# Patient Record
Sex: Female | Born: 1935 | Race: White | Hispanic: No | Marital: Married | Smoking: Never smoker
Health system: Southern US, Community
[De-identification: ages and names within clinical notes are randomized; demographics above are authoritative.]

## PROBLEM LIST (undated history)

## (undated) DIAGNOSIS — M810 Age-related osteoporosis without current pathological fracture: Secondary | ICD-10-CM

## (undated) DIAGNOSIS — M1990 Unspecified osteoarthritis, unspecified site: Secondary | ICD-10-CM

## (undated) DIAGNOSIS — E785 Hyperlipidemia, unspecified: Secondary | ICD-10-CM

## (undated) DIAGNOSIS — G4700 Insomnia, unspecified: Secondary | ICD-10-CM

## (undated) DIAGNOSIS — I493 Ventricular premature depolarization: Secondary | ICD-10-CM

## (undated) DIAGNOSIS — E7800 Pure hypercholesterolemia, unspecified: Secondary | ICD-10-CM

## (undated) DIAGNOSIS — K5792 Diverticulitis of intestine, part unspecified, without perforation or abscess without bleeding: Secondary | ICD-10-CM

## (undated) DIAGNOSIS — H409 Unspecified glaucoma: Secondary | ICD-10-CM

## (undated) DIAGNOSIS — I10 Essential (primary) hypertension: Secondary | ICD-10-CM

## (undated) HISTORY — PX: OOPHORECTOMY: SHX86

## (undated) HISTORY — PX: ABDOMINAL HYSTERECTOMY: SHX81

## (undated) HISTORY — PX: HERNIA REPAIR: SHX51

## (undated) HISTORY — PX: APPENDECTOMY: SHX54

## (undated) HISTORY — PX: EYE SURGERY: SHX253

---

## 2000-07-11 DIAGNOSIS — I10 Essential (primary) hypertension: Secondary | ICD-10-CM | POA: Insufficient documentation

## 2000-07-11 DIAGNOSIS — M1990 Unspecified osteoarthritis, unspecified site: Secondary | ICD-10-CM | POA: Insufficient documentation

## 2000-07-11 DIAGNOSIS — F329 Major depressive disorder, single episode, unspecified: Secondary | ICD-10-CM | POA: Insufficient documentation

## 2000-07-11 DIAGNOSIS — E789 Disorder of lipoprotein metabolism, unspecified: Secondary | ICD-10-CM | POA: Insufficient documentation

## 2000-08-03 DIAGNOSIS — N859 Noninflammatory disorder of uterus, unspecified: Secondary | ICD-10-CM | POA: Insufficient documentation

## 2000-08-03 DIAGNOSIS — H409 Unspecified glaucoma: Secondary | ICD-10-CM | POA: Insufficient documentation

## 2008-01-05 ENCOUNTER — Ambulatory Visit: Payer: Self-pay | Admitting: Family Medicine

## 2008-01-27 ENCOUNTER — Ambulatory Visit: Payer: Self-pay | Admitting: Family Medicine

## 2008-01-27 ENCOUNTER — Ambulatory Visit: Payer: Self-pay

## 2008-08-14 ENCOUNTER — Encounter: Payer: Self-pay | Admitting: Orthopaedic Surgery

## 2008-09-05 ENCOUNTER — Encounter: Payer: Self-pay | Admitting: Orthopaedic Surgery

## 2009-07-04 IMAGING — CR DG CHEST 2V
1 series · 2 of 2 positions shown · non-contrast
Comparison: none

REASON FOR EXAM: cough, fever
COMMENTS:

[Series 1: view not recorded · 0.17mm/px · 2 of 2 slices shown]
[im 1/2]
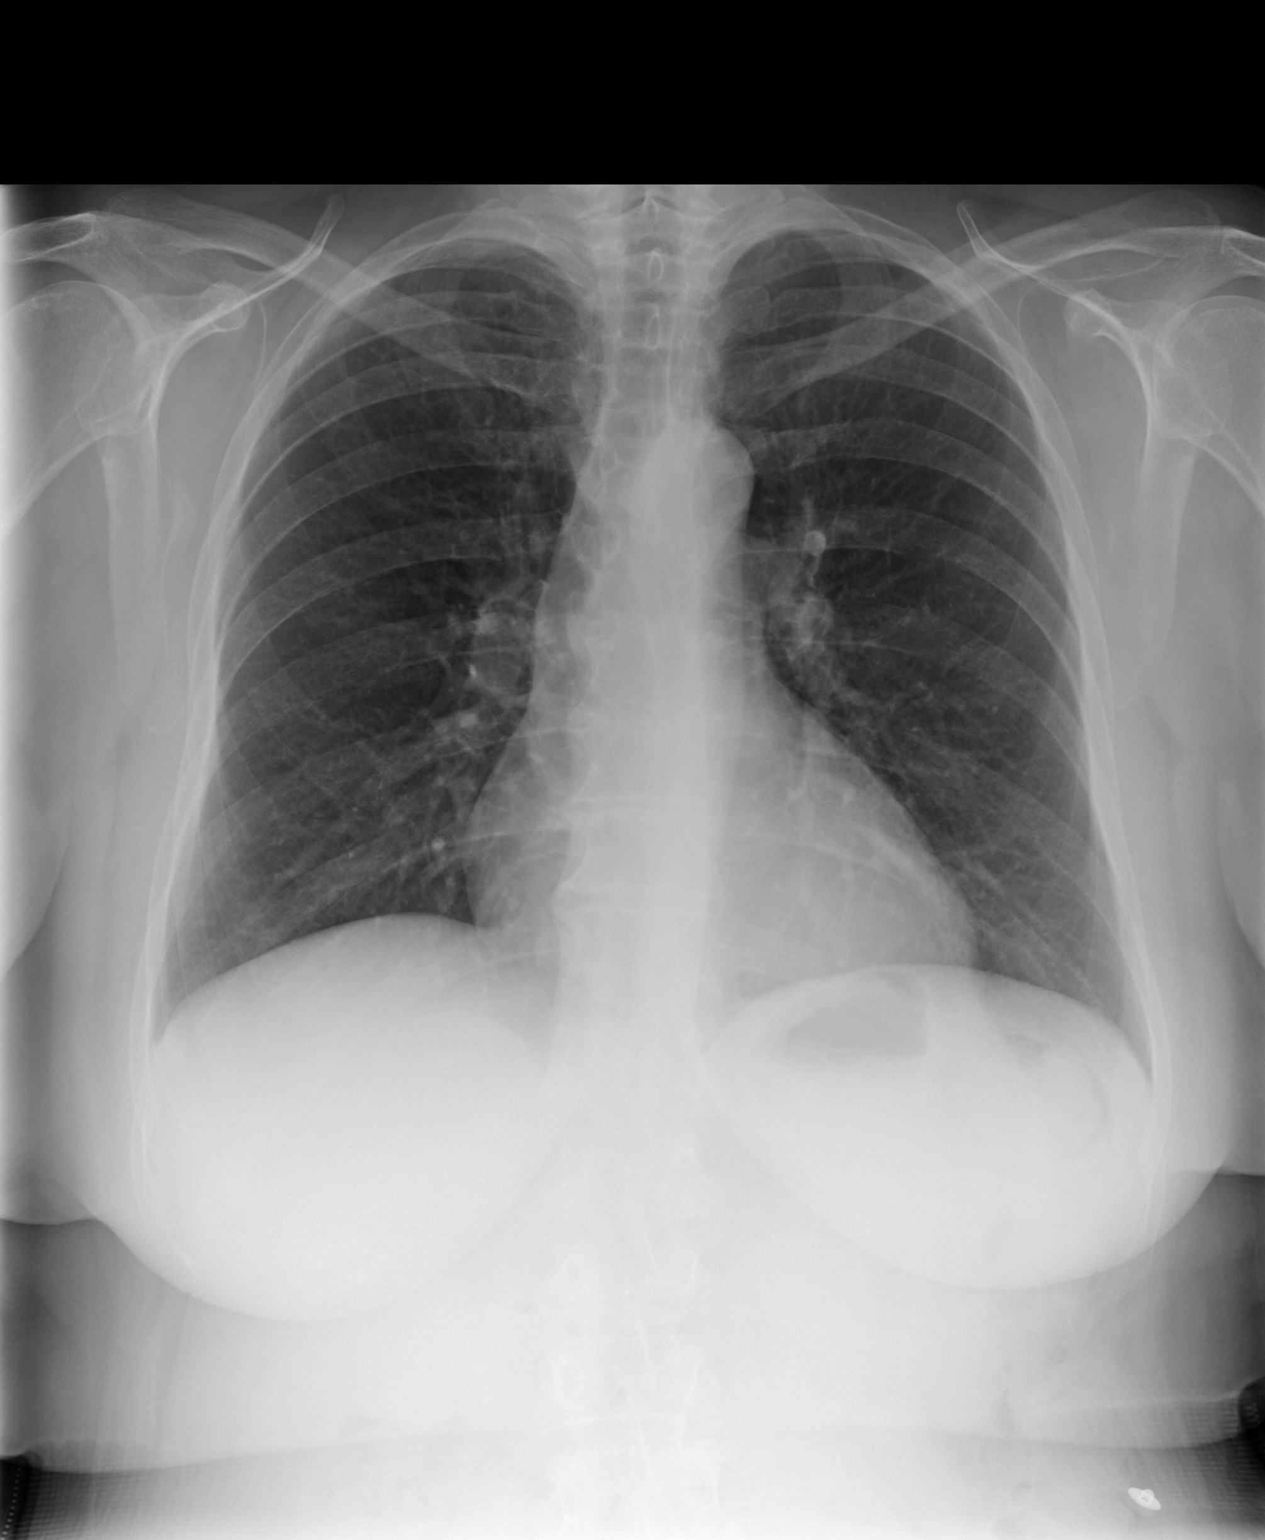
[im 2/2]
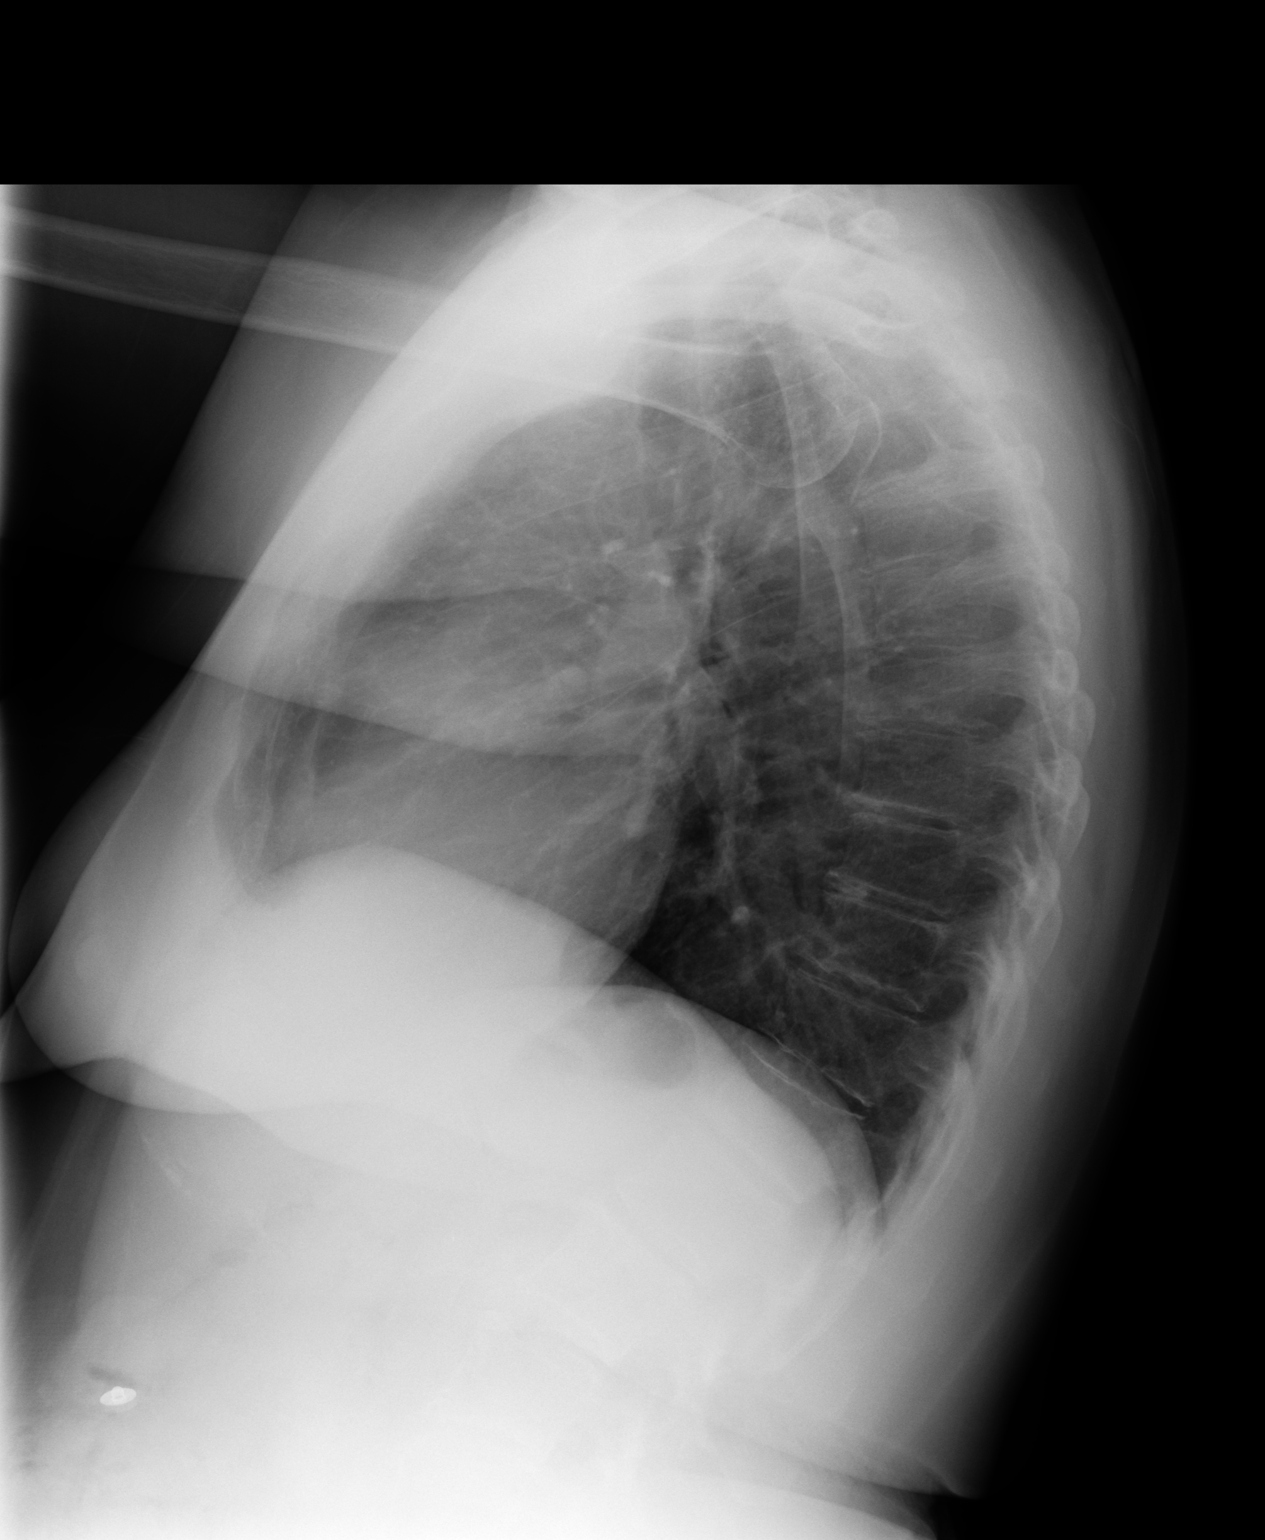

[2 of 2 positions shown; findings below may reference images not displayed]

PROCEDURE:     MDR - MDR CHEST PA(OR AP) AND LATERAL  - January 05, 2008 [DATE]

RESULT:     There is no prior exam for comparison.

The lungs are clear. The heart and pulmonary vessels are normal. The bony
and mediastinal structures are unremarkable. There is no effusion. There is
no pneumothorax or evidence of congestive failure.
IMPRESSION: No acute cardiopulmonary disease.

## 2011-01-05 DIAGNOSIS — K219 Gastro-esophageal reflux disease without esophagitis: Secondary | ICD-10-CM | POA: Insufficient documentation

## 2013-01-29 DIAGNOSIS — F411 Generalized anxiety disorder: Secondary | ICD-10-CM | POA: Insufficient documentation

## 2013-07-20 DIAGNOSIS — G5600 Carpal tunnel syndrome, unspecified upper limb: Secondary | ICD-10-CM | POA: Insufficient documentation

## 2013-10-15 DIAGNOSIS — K5900 Constipation, unspecified: Secondary | ICD-10-CM | POA: Insufficient documentation

## 2013-10-15 DIAGNOSIS — G629 Polyneuropathy, unspecified: Secondary | ICD-10-CM | POA: Insufficient documentation

## 2013-10-31 ENCOUNTER — Ambulatory Visit: Payer: Self-pay | Admitting: Physician Assistant

## 2013-10-31 LAB — CBC WITH DIFFERENTIAL/PLATELET
Basophil #: 0.1 10*3/uL (ref 0.0–0.1)
Basophil %: 0.8 %
Eosinophil #: 0.1 10*3/uL (ref 0.0–0.7)
Eosinophil %: 1.8 %
HCT: 38 % (ref 35.0–47.0)
Lymphocyte #: 1.6 10*3/uL (ref 1.0–3.6)
MCHC: 34.9 g/dL (ref 32.0–36.0)
Monocyte #: 0.6 x10 3/mm (ref 0.2–0.9)
Monocyte %: 8.2 %
Neutrophil %: 68.4 %
Platelet: 197 10*3/uL (ref 150–440)
RBC: 4.29 10*6/uL (ref 3.80–5.20)
WBC: 7.6 10*3/uL (ref 3.6–11.0)

## 2013-10-31 LAB — URINALYSIS, COMPLETE
Bilirubin,UR: NEGATIVE
Ketone: NEGATIVE
Nitrite: NEGATIVE
Specific Gravity: 1.01 (ref 1.003–1.030)

## 2013-10-31 LAB — COMPREHENSIVE METABOLIC PANEL
BUN: 15 mg/dL (ref 7–18)
Bilirubin,Total: 0.3 mg/dL (ref 0.2–1.0)
Calcium, Total: 9.2 mg/dL (ref 8.5–10.1)
Chloride: 97 mmol/L — ABNORMAL LOW (ref 98–107)
Co2: 27 mmol/L (ref 21–32)
Creatinine: 0.76 mg/dL (ref 0.60–1.30)
EGFR (African American): >60
Glucose: 117 mg/dL — ABNORMAL HIGH (ref 65–99)
Osmolality: 270 (ref 275–301)
Potassium: 3.8 mmol/L (ref 3.5–5.1)
SGOT(AST): 21 U/L (ref 15–37)
SGPT (ALT): 28 U/L (ref 12–78)
Total Protein: 7.1 g/dL (ref 6.4–8.2)

## 2014-01-28 ENCOUNTER — Emergency Department: Payer: Self-pay | Admitting: Emergency Medicine

## 2014-01-28 ENCOUNTER — Ambulatory Visit: Payer: Self-pay | Admitting: Physician Assistant

## 2014-01-28 LAB — BASIC METABOLIC PANEL
ANION GAP: 6 — AB (ref 7–16)
BUN: 16 mg/dL (ref 7–18)
Calcium, Total: 9.4 mg/dL (ref 8.5–10.1)
Chloride: 97 mmol/L — ABNORMAL LOW (ref 98–107)
Co2: 26 mmol/L (ref 21–32)
Creatinine: 0.59 mg/dL — ABNORMAL LOW (ref 0.60–1.30)
EGFR (African American): >60
EGFR (Non-African Amer.): >60
Glucose: 114 mg/dL — ABNORMAL HIGH (ref 65–99)
Osmolality: 261 (ref 275–301)
POTASSIUM: 4.2 mmol/L (ref 3.5–5.1)
Sodium: 129 mmol/L — ABNORMAL LOW (ref 136–145)

## 2014-01-28 LAB — URINALYSIS, COMPLETE
BLOOD: NEGATIVE
Bilirubin,UR: NEGATIVE
Glucose,UR: NEGATIVE mg/dL (ref 0–75)
KETONE: NEGATIVE
NITRITE: NEGATIVE
PH: 6 (ref 4.5–8.0)
Protein: NEGATIVE
Specific Gravity: 1.011 (ref 1.003–1.030)
Squamous Epithelial: 1
WBC UR: 53 /HPF (ref 0–5)

## 2014-01-28 LAB — CBC
HCT: 41.8 % (ref 35.0–47.0)
HGB: 14 g/dL (ref 12.0–16.0)
MCH: 29.5 pg (ref 26.0–34.0)
MCHC: 33.5 g/dL (ref 32.0–36.0)
MCV: 88 fL (ref 80–100)
Platelet: 198 10*3/uL (ref 150–440)
RBC: 4.75 10*6/uL (ref 3.80–5.20)
RDW: 12.7 % (ref 11.5–14.5)
WBC: 11.9 10*3/uL — AB (ref 3.6–11.0)

## 2014-01-28 LAB — TROPONIN I: Troponin-I: <0.02 ng/mL

## 2014-03-27 DIAGNOSIS — H9319 Tinnitus, unspecified ear: Secondary | ICD-10-CM | POA: Insufficient documentation

## 2014-03-27 DIAGNOSIS — N390 Urinary tract infection, site not specified: Secondary | ICD-10-CM | POA: Insufficient documentation

## 2014-03-27 DIAGNOSIS — G4700 Insomnia, unspecified: Secondary | ICD-10-CM | POA: Insufficient documentation

## 2014-03-27 DIAGNOSIS — M25519 Pain in unspecified shoulder: Secondary | ICD-10-CM | POA: Insufficient documentation

## 2014-04-14 DIAGNOSIS — E222 Syndrome of inappropriate secretion of antidiuretic hormone: Secondary | ICD-10-CM | POA: Insufficient documentation

## 2014-05-06 ENCOUNTER — Ambulatory Visit: Payer: Self-pay | Admitting: Family Medicine

## 2015-02-13 DIAGNOSIS — IMO0002 Reserved for concepts with insufficient information to code with codable children: Secondary | ICD-10-CM | POA: Insufficient documentation

## 2015-07-09 DIAGNOSIS — Z8719 Personal history of other diseases of the digestive system: Secondary | ICD-10-CM | POA: Insufficient documentation

## 2015-08-01 ENCOUNTER — Other Ambulatory Visit: Payer: Self-pay | Admitting: Family Medicine

## 2015-08-01 DIAGNOSIS — IMO0002 Reserved for concepts with insufficient information to code with codable children: Secondary | ICD-10-CM

## 2015-08-25 ENCOUNTER — Other Ambulatory Visit: Payer: Self-pay | Admitting: Family Medicine

## 2015-08-25 DIAGNOSIS — Z1231 Encounter for screening mammogram for malignant neoplasm of breast: Secondary | ICD-10-CM

## 2015-09-10 ENCOUNTER — Ambulatory Visit
Admission: RE | Admit: 2015-09-10 | Discharge: 2015-09-10 | Disposition: A | Discharge status: Home / Self Care | Payer: Medicare Other | Source: Ambulatory Visit | Attending: Family Medicine | Admitting: Family Medicine

## 2015-09-10 DIAGNOSIS — M810 Age-related osteoporosis without current pathological fracture: Secondary | ICD-10-CM | POA: Insufficient documentation

## 2015-09-10 DIAGNOSIS — T148 Other injury of unspecified body region: Secondary | ICD-10-CM | POA: Insufficient documentation

## 2015-09-10 DIAGNOSIS — Z1231 Encounter for screening mammogram for malignant neoplasm of breast: Secondary | ICD-10-CM | POA: Insufficient documentation

## 2015-09-10 DIAGNOSIS — X58XXXA Exposure to other specified factors, initial encounter: Secondary | ICD-10-CM | POA: Insufficient documentation

## 2015-09-10 DIAGNOSIS — IMO0002 Reserved for concepts with insufficient information to code with codable children: Secondary | ICD-10-CM

## 2015-11-04 DIAGNOSIS — M810 Age-related osteoporosis without current pathological fracture: Secondary | ICD-10-CM | POA: Insufficient documentation

## 2016-08-03 ENCOUNTER — Other Ambulatory Visit: Payer: Self-pay | Admitting: Family Medicine

## 2016-08-03 DIAGNOSIS — Z1231 Encounter for screening mammogram for malignant neoplasm of breast: Secondary | ICD-10-CM

## 2016-09-13 ENCOUNTER — Ambulatory Visit: Admission: RE | Admit: 2016-09-13 | Payer: Medicare Other | Source: Ambulatory Visit

## 2016-10-06 DIAGNOSIS — R0789 Other chest pain: Secondary | ICD-10-CM | POA: Insufficient documentation

## 2016-10-08 ENCOUNTER — Ambulatory Visit
Admission: RE | Admit: 2016-10-08 | Discharge: 2016-10-08 | Disposition: A | Discharge status: Home / Self Care | Payer: Medicare Other | Source: Ambulatory Visit | Attending: Family Medicine | Admitting: Family Medicine

## 2016-10-08 DIAGNOSIS — Z1231 Encounter for screening mammogram for malignant neoplasm of breast: Secondary | ICD-10-CM | POA: Diagnosis present

## 2017-02-25 DIAGNOSIS — I493 Ventricular premature depolarization: Secondary | ICD-10-CM | POA: Insufficient documentation

## 2017-09-01 ENCOUNTER — Other Ambulatory Visit: Payer: Self-pay | Admitting: Family Medicine

## 2017-09-01 DIAGNOSIS — Z1231 Encounter for screening mammogram for malignant neoplasm of breast: Secondary | ICD-10-CM

## 2017-10-10 ENCOUNTER — Ambulatory Visit
Admission: RE | Admit: 2017-10-10 | Discharge: 2017-10-10 | Disposition: A | Discharge status: Home / Self Care | Payer: Medicare Other | Source: Ambulatory Visit | Attending: Family Medicine | Admitting: Family Medicine

## 2017-10-10 DIAGNOSIS — Z1231 Encounter for screening mammogram for malignant neoplasm of breast: Secondary | ICD-10-CM | POA: Diagnosis not present

## 2018-02-07 ENCOUNTER — Ambulatory Visit
Admission: EM | Admit: 2018-02-07 | Discharge: 2018-02-07 | Disposition: A | Discharge status: Home / Self Care | Payer: Medicare Other | Attending: Family Medicine | Admitting: Family Medicine

## 2018-02-07 ENCOUNTER — Other Ambulatory Visit: Payer: Self-pay

## 2018-02-07 ENCOUNTER — Encounter: Payer: Self-pay | Admitting: Emergency Medicine

## 2018-02-07 DIAGNOSIS — I10 Essential (primary) hypertension: Secondary | ICD-10-CM

## 2018-02-07 DIAGNOSIS — S161XXA Strain of muscle, fascia and tendon at neck level, initial encounter: Secondary | ICD-10-CM | POA: Diagnosis not present

## 2018-02-07 DIAGNOSIS — N3000 Acute cystitis without hematuria: Secondary | ICD-10-CM

## 2018-02-07 DIAGNOSIS — R350 Frequency of micturition: Secondary | ICD-10-CM | POA: Diagnosis not present

## 2018-02-07 HISTORY — DX: Age-related osteoporosis without current pathological fracture: M81.0

## 2018-02-07 HISTORY — DX: Ventricular premature depolarization: I49.3

## 2018-02-07 HISTORY — DX: Diverticulitis of intestine, part unspecified, without perforation or abscess without bleeding: K57.92

## 2018-02-07 HISTORY — DX: Hyperlipidemia, unspecified: E78.5

## 2018-02-07 HISTORY — DX: Unspecified glaucoma: H40.9

## 2018-02-07 HISTORY — DX: Pure hypercholesterolemia, unspecified: E78.00

## 2018-02-07 HISTORY — DX: Unspecified osteoarthritis, unspecified site: M19.90

## 2018-02-07 HISTORY — DX: Insomnia, unspecified: G47.00

## 2018-02-07 HISTORY — DX: Essential (primary) hypertension: I10

## 2018-02-07 LAB — URINALYSIS, COMPLETE (UACMP) WITH MICROSCOPIC
Bilirubin Urine: NEGATIVE
Glucose, UA: NEGATIVE mg/dL
Hgb urine dipstick: NEGATIVE
KETONES UR: NEGATIVE mg/dL
Nitrite: NEGATIVE
PH: 7.5 (ref 5.0–8.0)
Protein, ur: NEGATIVE mg/dL
RBC / HPF: NONE SEEN RBC/hpf (ref 0–5)
Specific Gravity, Urine: 1.015 (ref 1.005–1.030)

## 2018-02-07 MED ORDER — SULFAMETHOXAZOLE-TRIMETHOPRIM 800-160 MG PO TABS: 1.0000 | ORAL_TABLET | Freq: Two times a day (BID) | ORAL | 0 refills | Status: DC

## 2018-02-07 MED ORDER — CLONIDINE HCL 0.1 MG PO TABS
0.1000 mg | ORAL_TABLET | Freq: Once | ORAL | Status: AC
  Administered 2018-02-07: 0.1 mg via ORAL

## 2018-02-07 NOTE — Discharge Instructions (Signed)
Drink more water Follow up with Primary Care provider

## 2018-02-07 NOTE — ED Provider Notes (Signed)
[facility]    CSN: [id number] Arrival date & time: 02/07/18  0905     History   Chief Complaint Chief Complaint  Patient presents with  . Neck Pain  . Hypertension    HPI LILLIANN ROSSETTI is a 84 y.o. female.   84 yo female with a multiple complaints of: 1. Left neck/upper back pain since yesterday; denies any falls, trauma, injuries, numbness/tingling, fevers, chills, swelling, rash  2. C/o urinary frequency that started today 3. C/o elevated blood pressure for the past few days as well; denies any fevers, chills, chest pains, shortness of breath.    The history is provided by the patient.    Past Medical History:  Diagnosis Date  . Arthritis   . Diverticulitis   . Glaucoma   . Hypercholesteremia   . Hyperlipidemia   . Hypertension   . Insomnia   . Osteoporosis   . PVC (premature ventricular contraction)     There are no active problems to display for this patient.   Past Surgical History:  Procedure Laterality Date  . ABDOMINAL HYSTERECTOMY    . APPENDECTOMY    . EYE SURGERY    . HERNIA REPAIR      OB History    No data available       Home Medications    Prior to Admission medications   Medication Sig Start Date End Date Taking? Authorizing Provider  amLODipine (NORVASC) 2.5 MG tablet Take 2.5 mg by mouth daily.   Yes [provider]  aspirin EC 81 MG tablet Take 81 mg by mouth daily.   Yes [provider]  atorvastatin (LIPITOR) 40 MG tablet Take 40 mg by mouth daily.   Yes [provider]  calcium-vitamin D (OSCAL WITH D) 500-200 MG-UNIT tablet Take 1 tablet by mouth.   Yes [provider]  diclofenac sodium (VOLTAREN) 1 % GEL Apply topically 4 (four) times daily.   Yes [provider]  fluticasone (FLONASE) 50 MCG/ACT nasal spray Place 1 spray into both nostrils daily.   Yes [provider]  gabapentin (NEURONTIN) 800 MG tablet Take 800 mg by mouth 3 (three) times daily.   Yes  [provider]  Multiple Vitamin (MULTIVITAMIN) tablet Take 1 tablet by mouth daily.   Yes [provider]  polyethylene glycol (MIRALAX / GLYCOLAX) packet Take 17 g by mouth daily.   Yes [provider]  traZODone (DESYREL) 50 MG tablet Take 50 mg by mouth at bedtime.   Yes [provider]  sulfamethoxazole-trimethoprim (BACTRIM DS,SEPTRA DS) 800-160 MG tablet Take 1 tablet by mouth 2 (two) times daily. 02/07/18   Payton Mccallum, MD    Family History Family History  Problem Relation Age of Onset  . Breast cancer Neg Hx     Social History Social History   Tobacco Use  . Smoking status: Never Smoker  . Smokeless tobacco: Never Used  Substance Use Topics  . Alcohol use: No    Frequency: Never  . Drug use: No     Allergies   Ace inhibitors; Amoxicillin; Lisinopril; and Ciprofloxacin   Review of Systems Review of Systems   Physical Exam Triage Vital Signs ED Triage Vitals  Enc Vitals Group     BP 02/07/18 0927 (S) (!) 183/109     Pulse Rate 02/07/18 0927 93     Resp 02/07/18 0927 16     Temp 02/07/18 0927 98.1 F (36.7 C)     Temp Source 02/07/18  8119 Oral     SpO2 02/07/18 0927 100 %     Weight 02/07/18 0924 160 lb (72.6 kg)     Height 02/07/18 0924 5' (1.524 m)     Head Circumference --      Peak Flow --      Pain Score 02/07/18 0924 5     Pain Loc --      Pain Edu? --      Excl. in GC? --    No data found.  Updated Vital Signs BP (!) 148/89 (BP Location: Left Arm)   Pulse 89   Temp 98.1 F (36.7 C) (Oral)   Resp 16   Ht 5' (1.524 m)   Wt 160 lb (72.6 kg)   SpO2 100%   BMI 31.25 kg/m   Visual Acuity Right Eye Distance:   Left Eye Distance:   Bilateral Distance:    Right Eye Near:   Left Eye Near:    Bilateral Near:     Physical Exam  Constitutional: She is oriented to person, place, and time. She appears well-developed and well-nourished. No distress.  HENT:  Head: Normocephalic and atraumatic.  Neck:  Normal range of motion. Neck supple. No tracheal deviation present. No thyromegaly present.  Cardiovascular: Normal rate, regular rhythm and normal heart sounds.  Pulmonary/Chest: Effort normal and breath sounds normal. No stridor. No respiratory distress. She has no wheezes. She has no rales.  Abdominal: Soft. Bowel sounds are normal. She exhibits no distension. There is no tenderness. There is no guarding.  Musculoskeletal: She exhibits no edema.       Cervical back: She exhibits tenderness (left trapezius) and spasm. She exhibits normal range of motion, no bony tenderness, no swelling, no edema, no deformity, no laceration and normal pulse.  Lymphadenopathy:    She has no cervical adenopathy.  Neurological: She is alert and oriented to person, place, and time. She has normal reflexes. No cranial nerve deficit. She exhibits normal muscle tone. Coordination normal.  Skin: She is not diaphoretic.  Nursing note and vitals reviewed.    UC Treatments / Results  Labs (all labs ordered are listed, but only abnormal results are displayed) Labs Reviewed  URINALYSIS, COMPLETE (UACMP) WITH MICROSCOPIC - Abnormal; Notable for the following components:      Result Value   Leukocytes, UA TRACE (*)    Squamous Epithelial / LPF 0-5 (*)    Bacteria, UA FEW (*)    All other components within normal limits  URINE CULTURE    EKG  EKG Interpretation None       Radiology No results found.  Procedures Procedures (including critical care time)  Medications Ordered in UC Medications  cloNIDine (CATAPRES) tablet 0.1 mg (0.1 mg Oral Given 02/07/18 0948)     Initial Impression / Assessment and Plan / UC Course  I have reviewed the triage vital signs and the nursing notes.  Pertinent labs & imaging results that were available during my care of the patient were reviewed by me and considered in my medical decision making (see chart for details).       Final Clinical Impressions(s) / UC  Diagnoses   Final diagnoses:  Essential hypertension  Strain of neck muscle, initial encounter  Acute cystitis without hematuria    ED Discharge Orders        Ordered    sulfamethoxazole-trimethoprim (BACTRIM DS,SEPTRA DS) 800-160 MG tablet  2 times daily     02/07/18 1031     1. Lab  results and diagnosis reviewed with patient; patient given clonidine 0.1mg  po x 1 with improvement of blood pressure 2. rx as per orders above; reviewed possible side effects, interactions, risks and benefits  3. Recommend supportive treatment with rest, fluids, heat/ice to neck area 4. Follow-up prn if symptoms worsen or don't improve  Controlled Substance Prescriptions [place] Controlled Substance Registry consulted? Not Applicable   Payton Mccallumonty, Shaul Trautman, MD 02/07/18 1220

## 2018-02-07 NOTE — ED Triage Notes (Signed)
Patient c/o neck pain that started yesterday.  Patient also reports that her blood pressure has been elevated for the past couple of days.

## 2018-02-08 LAB — URINE CULTURE: Culture: <10000 — AB

## 2018-08-01 DIAGNOSIS — I6523 Occlusion and stenosis of bilateral carotid arteries: Secondary | ICD-10-CM | POA: Insufficient documentation

## 2018-09-07 ENCOUNTER — Other Ambulatory Visit: Payer: Self-pay | Admitting: Family Medicine

## 2018-09-07 DIAGNOSIS — Z1231 Encounter for screening mammogram for malignant neoplasm of breast: Secondary | ICD-10-CM

## 2018-11-13 ENCOUNTER — Other Ambulatory Visit: Payer: Self-pay | Admitting: Family Medicine

## 2018-11-13 DIAGNOSIS — M810 Age-related osteoporosis without current pathological fracture: Secondary | ICD-10-CM

## 2018-11-13 DIAGNOSIS — Z1231 Encounter for screening mammogram for malignant neoplasm of breast: Secondary | ICD-10-CM

## 2018-11-16 ENCOUNTER — Other Ambulatory Visit: Payer: Self-pay | Admitting: Family Medicine

## 2018-11-16 DIAGNOSIS — Z1231 Encounter for screening mammogram for malignant neoplasm of breast: Secondary | ICD-10-CM

## 2018-12-05 ENCOUNTER — Ambulatory Visit
Admission: RE | Admit: 2018-12-05 | Discharge: 2018-12-05 | Disposition: A | Discharge status: Home / Self Care | Payer: Medicare Other | Source: Ambulatory Visit | Attending: Family Medicine | Admitting: Family Medicine

## 2018-12-05 ENCOUNTER — Encounter (INDEPENDENT_AMBULATORY_CARE_PROVIDER_SITE_OTHER): Payer: Self-pay

## 2018-12-05 DIAGNOSIS — M810 Age-related osteoporosis without current pathological fracture: Secondary | ICD-10-CM

## 2018-12-05 DIAGNOSIS — Z1231 Encounter for screening mammogram for malignant neoplasm of breast: Secondary | ICD-10-CM | POA: Diagnosis not present

## 2019-11-17 ENCOUNTER — Encounter: Payer: Self-pay | Admitting: Emergency Medicine

## 2019-11-17 ENCOUNTER — Other Ambulatory Visit: Payer: Self-pay

## 2019-11-17 ENCOUNTER — Ambulatory Visit
Admission: EM | Admit: 2019-11-17 | Discharge: 2019-11-17 | Disposition: A | Discharge status: Home / Self Care | Payer: Medicare Other | Attending: Family Medicine | Admitting: Family Medicine

## 2019-11-17 DIAGNOSIS — R509 Fever, unspecified: Secondary | ICD-10-CM | POA: Diagnosis not present

## 2019-11-17 DIAGNOSIS — R5383 Other fatigue: Secondary | ICD-10-CM | POA: Diagnosis not present

## 2019-11-17 DIAGNOSIS — Z20828 Contact with and (suspected) exposure to other viral communicable diseases: Secondary | ICD-10-CM

## 2019-11-17 DIAGNOSIS — Z20822 Contact with and (suspected) exposure to covid-19: Secondary | ICD-10-CM

## 2019-11-17 NOTE — ED Triage Notes (Signed)
Patient c/o fatigue that started over a week ago.  Patient denies fevers.

## 2019-11-17 NOTE — ED Provider Notes (Signed)
[place], [place]   Name: Emily Rivas DOB: [birth year] MRN: [id number] CSN: [id number] PCP: Gayland Curry, MD  Arrival date and time:  11/17/19 1333  Chief Complaint:  Fatigue   NOTE: Prior to seeing the patient today, I have reviewed the triage nursing documentation and vital signs. Clinical staff has updated patient's PMH/PSHx, current medication list, and drug allergies/intolerances to ensure comprehensive history available to assist in medical decision making.   History:   HPI: Emily Rivas is a 85 y.o. female  who presents today with complaints of fatigue that started approximately 7-10 days ago. Patient denies fevers, however is noted to have a low grade fever of 100.2 today in clinic today. She has not experienced any cough, shortness of breath, facial pain, myalgias, or otalgia.  She denies experiencing any nausea, vomiting, diarrhea, or abdominal pain. She is drinking well, however notes a decreased appetite overall. Patient denies any perceived alterations to her sense of taste or smell. Patient's niece tested positive for SARS-CoV-2 (novel coronavirus) within the last week. Patient has not been in close contact with her, however her sister has. She presents today with her sister who is experiencing a similar symptom constellation; sister lives in the same home with her. She has never been tested for SARS-CoV-2 (novel coronavirus) per her report. Patient has received her annual influenza vaccination this year. Despite her symptoms, patient has not taken any over the counter interventions to help improve/relieve her reported symptoms at home.   Past Medical History:  Diagnosis Date  . Arthritis   . Diverticulitis   . Glaucoma   . Hypercholesteremia   . Hyperlipidemia   . Hypertension   . Insomnia   . Osteoporosis   . PVC (premature ventricular contraction)     Past Surgical History:  Procedure Laterality Date  . ABDOMINAL HYSTERECTOMY    . APPENDECTOMY    . EYE SURGERY     . HERNIA REPAIR    . OOPHORECTOMY      Family History  Problem Relation Age of Onset  . Breast cancer Neg Hx     Social History   Tobacco Use  . Smoking status: Never Smoker  . Smokeless tobacco: Never Used  Substance Use Topics  . Alcohol use: No  . Drug use: No    There are no problems to display for this patient.   Home Medications:    Current Meds  Medication Sig  . amLODipine (NORVASC) 2.5 MG tablet Take 2.5 mg by mouth daily.  Marland Kitchen aspirin EC 81 MG tablet Take 81 mg by mouth daily.  Marland Kitchen atorvastatin (LIPITOR) 40 MG tablet Take 40 mg by mouth daily.  . calcium-vitamin D (OSCAL WITH D) 500-200 MG-UNIT tablet Take 1 tablet by mouth.  . fluticasone (FLONASE) 50 MCG/ACT nasal spray Place 1 spray into both nostrils daily.  Marland Kitchen gabapentin (NEURONTIN) 800 MG tablet Take 800 mg by mouth 3 (three) times daily.  . Multiple Vitamin (MULTIVITAMIN) tablet Take 1 tablet by mouth daily.  . traZODone (DESYREL) 50 MG tablet Take 50 mg by mouth at bedtime.    Allergies:   Ace inhibitors, Amoxicillin, Lisinopril, and Ciprofloxacin  Review of Systems (ROS): Review of Systems  Constitutional: Positive for appetite change (decreased), fatigue and fever.  HENT: Negative for congestion, ear pain, postnasal drip, rhinorrhea, sinus pressure, sinus pain, sneezing and sore throat.   Eyes: Negative for pain, discharge and redness.  Respiratory: Negative for cough, chest tightness and shortness of breath.   Cardiovascular: Negative  for chest pain and palpitations.  Gastrointestinal: Negative for abdominal pain, diarrhea, nausea and vomiting.  Musculoskeletal: Negative for arthralgias, back pain, myalgias and neck pain.  Skin: Negative for color change, pallor and rash.  Neurological: Negative for dizziness, syncope, weakness and headaches.  Hematological: Negative for adenopathy.     Vital Signs: Today's Vitals   11/17/19 1350 11/17/19 1351 11/17/19 1412  BP:  111/83   Pulse:  84   Resp:   16   Temp:  100.2 F (37.9 C)   TempSrc:  Oral   SpO2:  95%   Weight: 162 lb (73.5 kg)    Height: 5' (1.524 m)    PainSc: 0-No pain  0-No pain    Physical Exam: Physical Exam  Constitutional: She is oriented to person, place, and time and well-developed, well-nourished, and in no distress.  HENT:  Head: Normocephalic and atraumatic.  Nose: Nose normal.  Mouth/Throat: Uvula is midline, oropharynx is clear and moist and mucous membranes are normal.  Eyes: Pupils are equal, round, and reactive to light.  Cardiovascular: Normal rate, regular rhythm, normal heart sounds and intact distal pulses.  Pulmonary/Chest: Effort normal and breath sounds normal.  Musculoskeletal:     Cervical back: Normal range of motion and neck supple.  Neurological: She is alert and oriented to person, place, and time. Gait normal.  Skin: Skin is warm and dry. No rash noted. She is not diaphoretic.  Psychiatric: Mood, memory, affect and judgment normal.  Nursing note and vitals reviewed.   Urgent Care Treatments / Results:  LABS: PLEASE NOTE: all labs that were ordered this encounter are listed, however only abnormal results are displayed. Labs Reviewed  NOVEL CORONAVIRUS, NAA (HOSP ORDER, SEND-OUT TO REF LAB; TAT 18-24 HRS)    EKG: -None  RADIOLOGY: No results found.  PROCEDURES: Procedures  MEDICATIONS RECEIVED THIS VISIT: Medications - No data to display  PERTINENT CLINICAL COURSE NOTES/UPDATES:   Initial Impression / Assessment and Plan / Urgent Care Course:  Pertinent labs & imaging results that were available during my care of the patient were personally reviewed by me and considered in my medical decision making (see lab/imaging section of note for values and interpretations).  Emily Rivas is a 85 y.o. female who [facility] with complaints of Fatigue   Patient overall well appearing and in no acute distress today in clinic. Presenting symptoms (see HPI)  and exam as documented above. She presents with symptoms associated with SARS-CoV-2 (novel coronavirus) following an indirect exposure. Patient's niece tested positive this week. Her sister, who is being seen with similar symptoms, has been in contact with her daughter. Patient and sister live together in the same home. Discussed typical symptom constellation. Reviewed potential for infection and need for testing. Patient amenable to being tested. SARS-CoV-2 swab collected by certified clinical staff. Discussed variable turn around times associated with testing, as swabs are being processed at [facility], and have been taking between 2-5 days to come back. She was advised to self quarantine, per [facility] DHHS guidelines, until negative results received.   Presenting symptoms consistent with acute viral illness. Until ruled out with confirmatory lab testing, SARS-CoV-2 remains part of the differential. Her testing is pending at this time. I discussed with her that her symptoms are felt to be viral in nature, thus antibiotics would not offer her any relief or improve his symptoms any faster than conservative symptomatic management. Discussed supportive care measures at home during acute phase of illness.  Patient to rest as much as possible. She was encouraged to ensure adequate hydration (water and ORS) to prevent dehydration and electrolyte derangements. Patient may use APAP and/or IBU on an as needed basis for pain/fever.  These measures are being implemented out of an abundance of caution to prevent transmission and spread during the current SARS-CoV-2 pandemic.  Discussed follow up with primary care physician in 1 week for re-evaluation. I have reviewed the follow up and strict return precautions for any new or worsening symptoms. Patient is aware of symptoms that would be deemed urgent/emergent, and would thus require further evaluation either here or in the emergency department. At the time of discharge, she  verbalized understanding and consent with the discharge plan as it was reviewed with her. All questions were fielded by provider and/or clinic staff prior to patient discharge.    Final Clinical Impressions / Urgent Care Diagnoses:   Final diagnoses:  Fatigue, unspecified type  Fever, unspecified  Exposure to COVID-19 virus  Encounter for laboratory testing for COVID-19 virus    New Prescriptions:  [place] Controlled Substance Registry consulted? Not Applicable  No orders of the defined types were placed in this encounter.   Recommended Follow up Care:  Patient encouraged to follow up with the following provider within the specified time frame, or sooner as dictated by the severity of her symptoms. As always, she was instructed that for any urgent/emergent care needs, she should seek care either here or in the emergency department for more immediate evaluation.  Follow-up Information    Leim Fabry, MD In 1 week.   Specialty: Family Medicine Why: General reassessment of symptoms if not improving Contact information: [address] [place] [address] [phone]         NOTE: This note was prepared using Dragon dictation software along with smaller phrase technology. Despite my best ability to proofread, there is the potential that transcriptional errors may still occur from this process, and are completely unintentional.    Verlee Monte, NP 11/17/19 2307

## 2019-11-17 NOTE — Discharge Instructions (Signed)
It was very nice seeing you today in clinic. Thank you for entrusting me with your care.   Rest and Stay HYDRATED. Water and electrolyte containing beverages (Gatorade, Pedialyte) are best to prevent dehydration and electrolyte abnormalities. May use Tylenol and/or Ibuprofen as needed for pain/fever.   You were tested for SARS-CoV-2 (novel coronavirus) today. Testing is performed by an outside lab ([facility]) and has variable turn around times ranging between 2-5 days. Current recommendations from the the CDC and [place] DHHS require that you remain at home until negative test results are have been received. In the event that your test results are positive, you will be contacted with further directives. These measures are being implemented out of an abundance of caution to prevent transmission and spread during the current SARS-CoV-2 pandemic.  Make arrangements to follow up with your regular doctor in 1 week for re-evaluation if not improving. If your symptoms/condition worsens, please seek follow up care either here or in the ER. Please remember, our [place] providers are "right here with you" when you need us.   Again, it was my pleasure to take care of you today. Thank you for choosing our clinic. I hope that you start to feel better quickly.   Kashena Novitski, MSN, APRN, FNP-C, CEN Advanced Practice Provider [facility] 

## 2019-11-19 ENCOUNTER — Telehealth: Payer: Self-pay

## 2019-11-19 LAB — NOVEL CORONAVIRUS, NAA (HOSP ORDER, SEND-OUT TO REF LAB; TAT 18-24 HRS): SARS-CoV-2, NAA: DETECTED — AB

## 2019-11-19 NOTE — Telephone Encounter (Signed)
Pt and her daughter call in regarding positive COVID test. State she wants to know what to do now. Feeling better and no complaints currently. I advised pt to quarantine for 10 days after symptom onset and then 3 additional days of improvement of symptoms/no fever without Ibuprofen or Acetaminophen. Advised the health dept will likely be calling her for follow-up. Please reach out to Urgent Care or PCP if needs. Drink plenty of fluids and stay home, rest, and distance from others in the household. Pt verbalized understanding.

## 2019-11-20 ENCOUNTER — Telehealth (HOSPITAL_COMMUNITY): Payer: Self-pay | Admitting: Emergency Medicine

## 2019-11-20 NOTE — Telephone Encounter (Signed)
Patient contacted by phone and made aware of  positive covid  results. Pt verbalized understanding and had all questions answered.    

## 2020-01-07 DEATH — deceased

## 2020-11-02 ENCOUNTER — Encounter: Payer: Self-pay | Admitting: Emergency Medicine

## 2020-11-02 ENCOUNTER — Other Ambulatory Visit: Payer: Self-pay

## 2020-11-02 ENCOUNTER — Ambulatory Visit
Admission: EM | Admit: 2020-11-02 | Discharge: 2020-11-02 | Disposition: A | Discharge status: Home / Self Care | Payer: Medicare PPO | Attending: Emergency Medicine | Admitting: Emergency Medicine

## 2020-11-02 DIAGNOSIS — Z20822 Contact with and (suspected) exposure to covid-19: Secondary | ICD-10-CM | POA: Insufficient documentation

## 2020-11-02 DIAGNOSIS — Z880 Allergy status to penicillin: Secondary | ICD-10-CM | POA: Insufficient documentation

## 2020-11-02 DIAGNOSIS — J329 Chronic sinusitis, unspecified: Secondary | ICD-10-CM | POA: Diagnosis not present

## 2020-11-02 DIAGNOSIS — R051 Acute cough: Secondary | ICD-10-CM | POA: Insufficient documentation

## 2020-11-02 DIAGNOSIS — Z79899 Other long term (current) drug therapy: Secondary | ICD-10-CM | POA: Diagnosis not present

## 2020-11-02 DIAGNOSIS — Z888 Allergy status to other drugs, medicaments and biological substances status: Secondary | ICD-10-CM | POA: Diagnosis not present

## 2020-11-02 DIAGNOSIS — T7840XA Allergy, unspecified, initial encounter: Secondary | ICD-10-CM | POA: Insufficient documentation

## 2020-11-02 DIAGNOSIS — J40 Bronchitis, not specified as acute or chronic: Secondary | ICD-10-CM | POA: Insufficient documentation

## 2020-11-02 DIAGNOSIS — Z90721 Acquired absence of ovaries, unilateral: Secondary | ICD-10-CM | POA: Diagnosis not present

## 2020-11-02 DIAGNOSIS — X58XXXA Exposure to other specified factors, initial encounter: Secondary | ICD-10-CM | POA: Diagnosis not present

## 2020-11-02 DIAGNOSIS — Z881 Allergy status to other antibiotic agents status: Secondary | ICD-10-CM | POA: Diagnosis not present

## 2020-11-02 DIAGNOSIS — Z7982 Long term (current) use of aspirin: Secondary | ICD-10-CM | POA: Diagnosis not present

## 2020-11-02 LAB — RESP PANEL BY RT-PCR (FLU A&B, COVID) ARPGX2
Influenza A by PCR: NEGATIVE
Influenza B by PCR: NEGATIVE
SARS Coronavirus 2 by RT PCR: NEGATIVE

## 2020-11-02 MED ORDER — FEXOFENADINE HCL 180 MG PO TABS: 180.0000 mg | ORAL_TABLET | Freq: Every day | ORAL | 0 refills | Status: DC

## 2020-11-02 MED ORDER — PREDNISONE 20 MG PO TABS: 20.0000 mg | ORAL_TABLET | Freq: Every day | ORAL | 0 refills | Status: AC

## 2020-11-02 MED ORDER — BENZONATATE 100 MG PO CAPS: 100.0000 mg | ORAL_CAPSULE | Freq: Three times a day (TID) | ORAL | 0 refills | Status: DC

## 2020-11-02 MED ORDER — GUAIFENESIN-DM 100-10 MG/5ML PO SYRP: 5.0000 mL | ORAL_SOLUTION | ORAL | 0 refills | Status: DC | PRN

## 2020-11-02 NOTE — Discharge Instructions (Signed)
You were seen for cough and just congestion and are being treated for allergies and sinobronchitis.   Take your medications as prescribed.  If you start feeling worse or you start getting a fever, make sure you are reevaluated.  Take care, Dr. Sharlet Salina, NP-c

## 2020-11-02 NOTE — ED Triage Notes (Signed)
Patient c/o cough, chest congestion, and nasal congestion that started on Friday.  Patient denies fevers.

## 2020-11-02 NOTE — ED Provider Notes (Signed)
[facility] [place] [facility] [place]   Name: Emily Rivas DOB: 1929-05-27 MRN: [id number] CSN: [id number] PCP: Leim Fabry, MD  Arrival date and time:  11/02/20 1005  Chief Complaint:  Cough   NOTE: Prior to seeing the patient today, I have reviewed the triage nursing documentation and vital signs. Clinical staff has updated patient's PMH/PSHx, current medication list, and drug allergies/intolerances to ensure comprehensive history available to assist in medical decision making.   History:   HPI: Emily Rivas is a 86 y.o. female who presents today with complaints of cough and chest congestion x48 hours.  Patient states she started feeling symptoms on Friday morning and was not able to treated with anything at home.  She denies any fevers, body aches, increased fatigue, or decreased activity level/appetite.  Patient is fully vaccinated against COVID-19 and she has no known exposure to anyone COVID-19.   Past Medical History:  Diagnosis Date  . Arthritis   . Diverticulitis   . Glaucoma   . Hypercholesteremia   . Hyperlipidemia   . Hypertension   . Insomnia   . Osteoporosis   . PVC (premature ventricular contraction)     Past Surgical History:  Procedure Laterality Date  . ABDOMINAL HYSTERECTOMY    . APPENDECTOMY    . EYE SURGERY    . HERNIA REPAIR    . OOPHORECTOMY      Family History  Problem Relation Age of Onset  . Breast cancer Neg Hx     Social History   Tobacco Use  . Smoking status: Never Smoker  . Smokeless tobacco: Never Used  Vaping Use  . Vaping Use: Never used  Substance Use Topics  . Alcohol use: No  . Drug use: No    There are no problems to display for this patient.   Home Medications:    Current Meds  Medication Sig  . amLODipine (NORVASC) 2.5 MG tablet Take 2.5 mg by mouth daily.  Marland Kitchen aspirin EC 81 MG tablet Take 81 mg by mouth daily.  Marland Kitchen atorvastatin (LIPITOR) 40 MG tablet Take 40 mg by mouth daily.  . calcium-vitamin D (OSCAL  WITH D) 500-200 MG-UNIT tablet Take 1 tablet by mouth.  . diclofenac sodium (VOLTAREN) 1 % GEL Apply topically 4 (four) times daily.  . fluticasone (FLONASE) 50 MCG/ACT nasal spray Place 1 spray into both nostrils daily.  Marland Kitchen gabapentin (NEURONTIN) 800 MG tablet Take 800 mg by mouth 3 (three) times daily.  . traZODone (DESYREL) 50 MG tablet Take 50 mg by mouth at bedtime.    Allergies:   Ace inhibitors, Amoxicillin, Lisinopril, and Ciprofloxacin  Review of Systems (ROS): Review of Systems  Constitutional: Negative for activity change, appetite change, chills, fatigue and fever.  HENT: Positive for congestion and sore throat. Negative for ear discharge, ear pain, postnasal drip, sinus pain, sneezing and tinnitus.   Respiratory: Positive for cough. Negative for wheezing.   Gastrointestinal: Negative for nausea.  Musculoskeletal: Negative for myalgias.  Neurological: Negative for dizziness and weakness.  All other systems reviewed and are negative.    Vital Signs: Today's Vitals   11/02/20 1040 11/02/20 1043 11/02/20 1112  BP:  133/70   Pulse:  82   Resp:  14   Temp:  98.4 F (36.9 C)   TempSrc:  Oral   SpO2:  96%   Weight: 162 lb (73.5 kg)    Height: 5' (1.524 m)    PainSc: 0-No pain  0-No pain  Physical Exam: Physical Exam Vitals and nursing note reviewed.  Constitutional:      Appearance: Normal appearance.  HENT:     Right Ear: Tympanic membrane normal.     Left Ear: Tympanic membrane normal.     Nose: Nose normal.     Mouth/Throat:     Mouth: Mucous membranes are moist.     Pharynx: Posterior oropharyngeal erythema present.  Eyes:     Pupils: Pupils are equal, round, and reactive to light.  Cardiovascular:     Rate and Rhythm: Normal rate and regular rhythm.     Pulses: Normal pulses.     Heart sounds: Normal heart sounds.  Pulmonary:     Effort: Pulmonary effort is normal. No accessory muscle usage or respiratory distress.     Breath sounds: Normal breath  sounds. No decreased air movement. No wheezing.  Skin:    General: Skin is warm and dry.     Capillary Refill: Capillary refill takes less than 2 seconds.  Neurological:     Mental Status: She is alert.      Urgent Care Treatments / Results:   LABS: PLEASE NOTE: all labs that were ordered this encounter are listed, however only abnormal results are displayed. Labs Reviewed  RESP PANEL BY RT-PCR (FLU A&B, COVID) [id number]    EKG: -None  RADIOLOGY: No results found.  PROCEDURES: Procedures  MEDICATIONS RECEIVED THIS VISIT: Medications - No data to display  PERTINENT CLINICAL COURSE NOTES/UPDATES:   Initial Impression / Assessment and Plan / Urgent Care Course:  Pertinent labs & imaging results that were available during my care of the patient were personally reviewed by me and considered in my medical decision making (see lab/imaging section of note for values and interpretations).  Emily Rivas is a 86 y.o. female who presents to [facility] today with complaints of cough and chest congestion, diagnosed with allergies and sinobronchitis, and treated as such with the medications below. NP and patient reviewed discharge instructions below during visit.   Patient is well appearing overall in clinic today. She does not appear to be in any acute distress. Presenting symptoms (see HPI) and exam as documented above.   I have reviewed the follow up and strict return precautions for any new or worsening symptoms. Patient is aware of symptoms that would be deemed urgent/emergent, and would thus require further evaluation either here or in the emergency department. At the time of discharge, she verbalized understanding and consent with the discharge plan as it was reviewed with her. All questions were fielded by provider and/or clinic staff prior to patient discharge.    Final Clinical Impressions / Urgent Care Diagnoses:   Final diagnoses:  Allergy, initial encounter    Sinobronchitis    New Prescriptions:  [place] Controlled Substance Registry consulted? Not Applicable  Meds ordered this encounter  Medications  . predniSONE (DELTASONE) 20 MG tablet    Sig: Take 1 tablet (20 mg total) by mouth daily for 5 days.    Dispense:  5 tablet    Refill:  0  . fexofenadine (ALLEGRA) 180 MG tablet    Sig: Take 1 tablet (180 mg total) by mouth daily.    Dispense:  30 tablet    Refill:  0  . benzonatate (TESSALON) 100 MG capsule    Sig: Take 1 capsule (100 mg total) by mouth every 8 (eight) hours.    Dispense:  21 capsule    Refill:  0  . guaiFENesin-dextromethorphan (ROBITUSSIN  DM) 100-10 MG/5ML syrup    Sig: Take 5 mLs by mouth every 4 (four) hours as needed for cough.    Dispense:  118 mL    Refill:  0      Discharge Instructions     You were seen for cough and just congestion and are being treated for allergies and sinobronchitis.   Take your medications as prescribed.  If you start feeling worse or you start getting a fever, make sure you are reevaluated.  Take care, Dr. Sharlet Salina, NP-c     Recommended Follow up Care:  Patient encouraged to follow up with the following provider within the specified time frame, or sooner as dictated by the severity of her symptoms. As always, she was instructed that for any urgent/emergent care needs, she should seek care either here or in the emergency department for more immediate evaluation.   Bailey Mech, DNP, NP-c    Bailey Mech, NP 11/02/20 1308

## 2021-03-09 ENCOUNTER — Other Ambulatory Visit (INDEPENDENT_AMBULATORY_CARE_PROVIDER_SITE_OTHER): Payer: Self-pay | Admitting: Vascular Surgery

## 2021-03-09 ENCOUNTER — Encounter (INDEPENDENT_AMBULATORY_CARE_PROVIDER_SITE_OTHER): Payer: Self-pay | Admitting: Vascular Surgery

## 2021-03-09 ENCOUNTER — Encounter (INDEPENDENT_AMBULATORY_CARE_PROVIDER_SITE_OTHER): Payer: Self-pay

## 2021-03-09 DIAGNOSIS — I739 Peripheral vascular disease, unspecified: Secondary | ICD-10-CM

## 2021-03-10 ENCOUNTER — Other Ambulatory Visit: Payer: Self-pay | Admitting: Family Medicine

## 2021-03-10 DIAGNOSIS — M810 Age-related osteoporosis without current pathological fracture: Secondary | ICD-10-CM

## 2021-03-12 ENCOUNTER — Other Ambulatory Visit: Payer: Self-pay | Admitting: Physician Assistant

## 2021-03-12 DIAGNOSIS — R011 Cardiac murmur, unspecified: Secondary | ICD-10-CM | POA: Insufficient documentation

## 2021-03-12 DIAGNOSIS — M7989 Other specified soft tissue disorders: Secondary | ICD-10-CM

## 2021-03-13 ENCOUNTER — Ambulatory Visit
Admission: RE | Admit: 2021-03-13 | Discharge: 2021-03-13 | Disposition: A | Discharge status: Home / Self Care | Payer: Medicare PPO | Source: Ambulatory Visit | Attending: Physician Assistant | Admitting: Physician Assistant

## 2021-03-13 ENCOUNTER — Other Ambulatory Visit: Payer: Self-pay

## 2021-03-13 DIAGNOSIS — M7989 Other specified soft tissue disorders: Secondary | ICD-10-CM | POA: Diagnosis not present

## 2021-04-13 ENCOUNTER — Ambulatory Visit (INDEPENDENT_AMBULATORY_CARE_PROVIDER_SITE_OTHER): Payer: Medicare PPO | Admitting: Vascular Surgery

## 2021-04-13 ENCOUNTER — Encounter (INDEPENDENT_AMBULATORY_CARE_PROVIDER_SITE_OTHER): Payer: Self-pay | Admitting: Vascular Surgery

## 2021-04-13 ENCOUNTER — Other Ambulatory Visit: Payer: Self-pay

## 2021-04-13 ENCOUNTER — Ambulatory Visit (INDEPENDENT_AMBULATORY_CARE_PROVIDER_SITE_OTHER): Payer: Medicare PPO

## 2021-04-13 VITALS — BP 186/78 | HR 90 | Resp 16 | Ht 60.0 in | Wt 165.0 lb

## 2021-04-13 DIAGNOSIS — I10 Essential (primary) hypertension: Secondary | ICD-10-CM | POA: Diagnosis not present

## 2021-04-13 DIAGNOSIS — I739 Peripheral vascular disease, unspecified: Secondary | ICD-10-CM

## 2021-04-13 DIAGNOSIS — E785 Hyperlipidemia, unspecified: Secondary | ICD-10-CM | POA: Insufficient documentation

## 2021-04-13 DIAGNOSIS — I890 Lymphedema, not elsewhere classified: Secondary | ICD-10-CM | POA: Diagnosis not present

## 2021-04-13 DIAGNOSIS — E782 Mixed hyperlipidemia: Secondary | ICD-10-CM | POA: Diagnosis not present

## 2021-04-13 NOTE — Progress Notes (Signed)
MRN : [id number]  Emily Rivas is a 87 y.o. (1929-06-01) female who presents with chief complaint of  Chief Complaint  Patient presents with  . New Patient (Initial Visit)    Ref [facility] PAD  .  History of Present Illness:   Patient is seen for evaluation of right leg discomfort and right ankle swelling.   She recently had a home vascular test which was reported as abnormal  The patient first noticed the swelling remotely. The swelling is associated with discomfort and discoloration. The discomfort and swelling worsens with prolonged dependency and improves with elevation. The pain is unrelated to activity.  The patient notes that in the morning the legs are significantly improved but they steadily worsened throughout the course of the day. The patient also notes a steady worsening of the discoloration in the ankle and shin area.   The patient denies claudication symptoms.  The patient denies symptoms consistent with rest pain.  The patient has no had any past angiography, interventions or vascular surgery.  Elevation makes the leg symptoms better, dependency makes them much worse. There is no history of ulcerations. The patient denies any recent changes in medications.  The patient has not been wearing graduated compression.  The patient denies a history of DVT or PE. There is no prior history of phlebitis. There is no history of primary lymphedema.  No history of malignancies. No history of trauma or groin or pelvic surgery. There is no history of radiation treatment to the groin or pelvis  The patient denies amaurosis fugax or recent TIA symptoms. There are no recent neurological changes noted. The patient denies recent episodes of angina or shortness of breath  ABI Rt=1.14 and Lt=1.15 (triphasic signals)  Current Meds  Medication Sig  . amLODipine (NORVASC) 2.5 MG tablet Take 2.5 mg by mouth daily.  Marland Kitchen aspirin EC 81 MG tablet Take 81 mg by mouth daily.  Marland Kitchen atorvastatin  (LIPITOR) 40 MG tablet Take 40 mg by mouth daily.  . benzonatate (TESSALON) 100 MG capsule Take 1 capsule (100 mg total) by mouth every 8 (eight) hours.  . calcium-vitamin D (OSCAL WITH D) 500-200 MG-UNIT tablet Take 1 tablet by mouth.  . diclofenac sodium (VOLTAREN) 1 % GEL Apply topically 4 (four) times daily.  . fluticasone (FLONASE) 50 MCG/ACT nasal spray Place 1 spray into both nostrils daily.  Marland Kitchen gabapentin (NEURONTIN) 800 MG tablet Take 800 mg by mouth 3 (three) times daily.  Marland Kitchen guaiFENesin-dextromethorphan (ROBITUSSIN DM) 100-10 MG/5ML syrup Take 5 mLs by mouth every 4 (four) hours as needed for cough.  . Multiple Vitamin (MULTIVITAMIN) tablet Take 1 tablet by mouth daily.  . polyethylene glycol (MIRALAX / GLYCOLAX) packet Take 17 g by mouth daily.  . traZODone (DESYREL) 50 MG tablet Take 50 mg by mouth at bedtime.    Past Medical History:  Diagnosis Date  . Arthritis   . Diverticulitis   . Glaucoma   . Hypercholesteremia   . Hyperlipidemia   . Hypertension   . Insomnia   . Osteoporosis   . PVC (premature ventricular contraction)     Past Surgical History:  Procedure Laterality Date  . ABDOMINAL HYSTERECTOMY    . APPENDECTOMY    . EYE SURGERY    . HERNIA REPAIR    . OOPHORECTOMY      Social History Social History   Tobacco Use  . Smoking status: Never Smoker  . Smokeless tobacco: Never Used  Vaping Use  . Vaping Use:  Never used  Substance Use Topics  . Alcohol use: No  . Drug use: No    Family History Family History  Problem Relation Age of Onset  . Liver cancer Mother   . Cirrhosis Mother   . Heart attack Father   . Breast cancer Neg Hx   No family history of bleeding/clotting disorders, porphyria or autoimmune disease   Allergies  Allergen Reactions  . Ace Inhibitors Cough  . Amoxicillin Swelling  . Lisinopril Swelling  . Ciprofloxacin Rash     REVIEW OF SYSTEMS (Negative unless checked)  Constitutional: [] Weight loss  [] Fever   [] Chills Cardiac: [] Chest pain   [] Chest pressure   [] Palpitations   [] Shortness of breath when laying flat   [] Shortness of breath with exertion. Vascular:  [] Pain in legs with walking   [] Pain in legs at rest  [] History of DVT   [] Phlebitis   [x] Swelling in legs   [] Varicose veins   [] Non-healing ulcers Pulmonary:   [] Uses home oxygen   [] Productive cough   [] Hemoptysis   [] Wheeze  [] COPD   [] Asthma Neurologic:  [] Dizziness   [] Seizures   [] History of stroke   [] History of TIA  [] Aphasia   [] Vissual changes   [] Weakness or numbness in arm   [] Weakness or numbness in leg Musculoskeletal:   [] Joint swelling   [x] Joint pain   [] Low back pain Hematologic:  [] Easy bruising  [] Easy bleeding   [] Hypercoagulable state   [] Anemic Gastrointestinal:  [] Diarrhea   [] Vomiting  [] Gastroesophageal reflux/heartburn   [] Difficulty swallowing. Genitourinary:  [] Chronic kidney disease   [] Difficult urination  [] Frequent urination   [] Blood in urine Skin:  [] Rashes   [] Ulcers  Psychological:  [] History of anxiety   []  History of major depression.  Physical Examination  Vitals:   04/13/21 1448  BP: (!) 186/78  Pulse: 90  Resp: 16  Weight: 165 lb (74.8 kg)  Height: 5' (1.524 m)   Body mass index is 32.22 kg/m. Gen: WD/WN, NAD Head: [place]/AT, No temporalis wasting.  Ear/Nose/Throat: Hearing grossly intact, nares w/o erythema or drainage, poor dentition Eyes: PER, EOMI, sclera nonicteric.  Neck: Supple, no masses.  No bruit or JVD.  Pulmonary:  Good air movement, clear to auscultation bilaterally, no use of accessory muscles.  Cardiac: RRR, normal S1, S2, no Murmurs. Vascular:scattered varicosities present bilaterally.  Mild venous stasis changes to the legs bilaterally.  2+ soft pitting edema right > left Vessel Right Left  Radial Palpable Palpable  PT Palpable Palpable  DP Palpable Palpable  Gastrointestinal: soft, non-distended. No guarding/no peritoneal signs.  Musculoskeletal: M/S 5/5 throughout.   No deformity or atrophy.  Neurologic: CN 2-12 intact. Pain and light touch intact in extremities.  Symmetrical.  Speech is fluent. Motor exam as listed above. Psychiatric: Judgment intact, Mood & affect appropriate for pt's clinical situation. Dermatologic: Venous rashes no ulcers noted.  No changes consistent with cellulitis.   CBC Lab Results  Component Value Date   WBC 11.9 (H) 01/28/2014   HGB 14.0 01/28/2014   HCT 41.8 01/28/2014   MCV 88 01/28/2014   PLT 198 01/28/2014    BMET    Component Value Date/Time   NA 129 (L) 01/28/2014 2113   K 4.2 01/28/2014 2113   CL 97 (L) 01/28/2014 2113   CO2 26 01/28/2014 2113   GLUCOSE 114 (H) 01/28/2014 2113   BUN 16 01/28/2014 2113   CREATININE 0.59 (L) 01/28/2014 2113   CALCIUM 9.4 01/28/2014 2113   GFRNONAA >60 01/28/2014 2113  GFRAA >60 01/28/2014 2113   CrCl cannot be calculated (Patient's most recent lab result is older than the maximum 21 days allowed.).  COAG No results found for: INR, PROTIME  Radiology No results found.   Assessment/Plan 1. PAD (peripheral artery disease) (HCC) Recommend:  I do not find evidence of life style limiting vascular disease. The patient specifically denies life style limitation.  Previous noninvasive studies including ABI's of the legs do not identify critical vascular problems.  The patient should continue walking and begin a more formal exercise program. The patient should continue his antiplatelet therapy and aggressive treatment of the lipid abnormalities.  The patient should begin wearing graduated compression socks 15-20 mmHg strength to control her mild edema.  Patient will follow-up with me on a PRN basis  2. Lymphedema No surgery or intervention at this point in time.  I have reviewed my discussion with the patient regarding venous insufficiency and why it causes symptoms. I have discussed with the patient the chronic skin changes that accompany venous insufficiency and  the long term sequela such as ulceration. Patient will contnue wearing graduated compression stockings on a daily basis, as this has provided excellent control of his edema. The patient will put the stockings on first thing in the morning and removing them in the evening. The patient is reminded not to sleep in the stockings.  In addition, behavioral modification including elevation during the day will be initiated. Exercise is strongly encouraged.  Given the patient's good control and lack of any problems regarding the venous insufficiency and lymphedema a lymph pump in not need at this time.  The patient will follow up with me PRN should anything change.  The patient voices agreement with this plan.   3. Essential hypertension Continue antihypertensive medications as already ordered, these medications have been reviewed and there are no changes at this time.   4. Mixed hyperlipidemia Continue statin as ordered and reviewed, no changes at this time     Levora Dredge, MD  04/13/2021 3:34 PM

## 2021-05-12 ENCOUNTER — Observation Stay
Admission: EM | Admit: 2021-05-12 | Discharge: 2021-05-15 | Disposition: A | Discharge status: Home / Self Care | Payer: Medicare PPO | Attending: Internal Medicine | Admitting: Internal Medicine

## 2021-05-12 ENCOUNTER — Encounter: Payer: Self-pay | Admitting: Emergency Medicine

## 2021-05-12 ENCOUNTER — Ambulatory Visit (INDEPENDENT_AMBULATORY_CARE_PROVIDER_SITE_OTHER)
Admission: EM | Admit: 2021-05-12 | Discharge: 2021-05-12 | Disposition: A | Discharge status: Home / Self Care | Payer: Medicare PPO | Source: Home / Self Care | Attending: Family Medicine | Admitting: Family Medicine

## 2021-05-12 ENCOUNTER — Other Ambulatory Visit: Payer: Self-pay

## 2021-05-12 ENCOUNTER — Ambulatory Visit (INDEPENDENT_AMBULATORY_CARE_PROVIDER_SITE_OTHER): Payer: Medicare PPO

## 2021-05-12 DIAGNOSIS — Z79899 Other long term (current) drug therapy: Secondary | ICD-10-CM | POA: Insufficient documentation

## 2021-05-12 DIAGNOSIS — D72829 Elevated white blood cell count, unspecified: Secondary | ICD-10-CM

## 2021-05-12 DIAGNOSIS — F5101 Primary insomnia: Secondary | ICD-10-CM

## 2021-05-12 DIAGNOSIS — Z881 Allergy status to other antibiotic agents status: Secondary | ICD-10-CM | POA: Insufficient documentation

## 2021-05-12 DIAGNOSIS — F32A Depression, unspecified: Secondary | ICD-10-CM | POA: Diagnosis present

## 2021-05-12 DIAGNOSIS — Z888 Allergy status to other drugs, medicaments and biological substances status: Secondary | ICD-10-CM | POA: Insufficient documentation

## 2021-05-12 DIAGNOSIS — Z880 Allergy status to penicillin: Secondary | ICD-10-CM | POA: Insufficient documentation

## 2021-05-12 DIAGNOSIS — E871 Hypo-osmolality and hyponatremia: Secondary | ICD-10-CM | POA: Diagnosis not present

## 2021-05-12 DIAGNOSIS — R2681 Unsteadiness on feet: Secondary | ICD-10-CM | POA: Diagnosis not present

## 2021-05-12 DIAGNOSIS — J189 Pneumonia, unspecified organism: Secondary | ICD-10-CM | POA: Diagnosis not present

## 2021-05-12 DIAGNOSIS — F329 Major depressive disorder, single episode, unspecified: Secondary | ICD-10-CM | POA: Diagnosis not present

## 2021-05-12 DIAGNOSIS — M1991 Primary osteoarthritis, unspecified site: Secondary | ICD-10-CM

## 2021-05-12 DIAGNOSIS — Z20822 Contact with and (suspected) exposure to covid-19: Secondary | ICD-10-CM | POA: Insufficient documentation

## 2021-05-12 DIAGNOSIS — I10 Essential (primary) hypertension: Secondary | ICD-10-CM | POA: Insufficient documentation

## 2021-05-12 DIAGNOSIS — K219 Gastro-esophageal reflux disease without esophagitis: Secondary | ICD-10-CM | POA: Diagnosis present

## 2021-05-12 DIAGNOSIS — G629 Polyneuropathy, unspecified: Secondary | ICD-10-CM

## 2021-05-12 DIAGNOSIS — G4700 Insomnia, unspecified: Secondary | ICD-10-CM | POA: Diagnosis present

## 2021-05-12 DIAGNOSIS — R051 Acute cough: Secondary | ICD-10-CM | POA: Insufficient documentation

## 2021-05-12 DIAGNOSIS — R059 Cough, unspecified: Secondary | ICD-10-CM

## 2021-05-12 DIAGNOSIS — Z7982 Long term (current) use of aspirin: Secondary | ICD-10-CM | POA: Insufficient documentation

## 2021-05-12 DIAGNOSIS — M1990 Unspecified osteoarthritis, unspecified site: Secondary | ICD-10-CM | POA: Diagnosis present

## 2021-05-12 DIAGNOSIS — I739 Peripheral vascular disease, unspecified: Secondary | ICD-10-CM | POA: Diagnosis present

## 2021-05-12 DIAGNOSIS — Z791 Long term (current) use of non-steroidal anti-inflammatories (NSAID): Secondary | ICD-10-CM | POA: Insufficient documentation

## 2021-05-12 DIAGNOSIS — E785 Hyperlipidemia, unspecified: Secondary | ICD-10-CM | POA: Diagnosis present

## 2021-05-12 LAB — BASIC METABOLIC PANEL
Anion gap: 8 (ref 5–15)
BUN: 18 mg/dL (ref 8–23)
CO2: 25 mmol/L (ref 22–32)
Calcium: 9.2 mg/dL (ref 8.9–10.3)
Chloride: 99 mmol/L (ref 98–111)
Creatinine, Ser: 0.65 mg/dL (ref 0.44–1.00)
GFR, Estimated: >60 mL/min (ref >60)
Glucose, Bld: 144 mg/dL — ABNORMAL HIGH (ref 70–99)
Potassium: 4 mmol/L (ref 3.5–5.1)
Sodium: 132 mmol/L — ABNORMAL LOW (ref 135–145)

## 2021-05-12 LAB — CBC WITH DIFFERENTIAL/PLATELET
Abs Immature Granulocytes: 0.15 10*3/uL — ABNORMAL HIGH (ref 0.00–0.07)
Basophils Absolute: 0.1 10*3/uL (ref 0.0–0.1)
Basophils Relative: 0 %
Eosinophils Absolute: 0 10*3/uL (ref 0.0–0.5)
Eosinophils Relative: 0 %
HCT: 38.6 % (ref 36.0–46.0)
Hemoglobin: 13 g/dL (ref 12.0–15.0)
Immature Granulocytes: 1 %
Lymphocytes Relative: 7 %
Lymphs Abs: 1.6 10*3/uL (ref 0.7–4.0)
MCH: 29.3 pg (ref 26.0–34.0)
MCHC: 33.7 g/dL (ref 30.0–36.0)
MCV: 87.1 fL (ref 80.0–100.0)
Monocytes Absolute: 1.5 10*3/uL — ABNORMAL HIGH (ref 0.1–1.0)
Monocytes Relative: 6 %
Neutro Abs: 20.9 10*3/uL — ABNORMAL HIGH (ref 1.7–7.7)
Neutrophils Relative %: 86 %
Platelets: 199 10*3/uL (ref 150–400)
RBC: 4.43 MIL/uL (ref 3.87–5.11)
RDW: 13.2 % (ref 11.5–15.5)
WBC: 24.2 10*3/uL — ABNORMAL HIGH (ref 4.0–10.5)
nRBC: 0 % (ref 0.0–0.2)

## 2021-05-12 LAB — RESP PANEL BY RT-PCR (FLU A&B, COVID) ARPGX2
Influenza A by PCR: NEGATIVE
Influenza B by PCR: NEGATIVE
SARS Coronavirus 2 by RT PCR: NEGATIVE

## 2021-05-12 LAB — INFLUENZA A AND B ANTIGEN (CONVERTED LAB)
INFLUENZA A ANTIGEN, POC: NEGATIVE
INFLUENZA B ANTIGEN, POC: NEGATIVE

## 2021-05-12 LAB — LACTIC ACID, PLASMA: Lactic Acid, Venous: 1 mmol/L (ref 0.5–1.9)

## 2021-05-12 LAB — POC SARS CORONAVIRUS 2 AG: SARSCOV2ONAVIRUS 2 AG: NEGATIVE

## 2021-05-12 MED ORDER — AMLODIPINE BESYLATE 5 MG PO TABS
10.0000 mg | ORAL_TABLET | Freq: Every day | ORAL | Status: DC
  Administered 2021-05-13 – 2021-05-14 (×2): 10 mg via ORAL
  Filled 2021-05-12 (×2): qty 2

## 2021-05-12 MED ORDER — GUAIFENESIN-DM 100-10 MG/5ML PO SYRP
10.0000 mL | ORAL_SOLUTION | Freq: Two times a day (BID) | ORAL | Status: DC
  Administered 2021-05-13 – 2021-05-14 (×4): 10 mL via ORAL
  Filled 2021-05-12 (×4): qty 10

## 2021-05-12 MED ORDER — SODIUM CHLORIDE 0.9 % IV SOLN
1.0000 g | INTRAVENOUS | Status: DC
  Filled 2021-05-12: qty 10

## 2021-05-12 MED ORDER — HYDROCOD POLST-CPM POLST ER 10-8 MG/5ML PO SUER
5.0000 mL | Freq: Every day | ORAL | Status: AC
  Administered 2021-05-12 – 2021-05-13 (×2): 5 mL via ORAL
  Filled 2021-05-12 (×2): qty 5

## 2021-05-12 MED ORDER — GUAIFENESIN-DM 100-10 MG/5ML PO SYRP: 5.0000 mL | ORAL_SOLUTION | ORAL | Status: DC | PRN

## 2021-05-12 MED ORDER — GABAPENTIN 400 MG PO CAPS
800.0000 mg | ORAL_CAPSULE | Freq: Three times a day (TID) | ORAL | Status: DC
  Administered 2021-05-12 – 2021-05-13 (×2): 800 mg via ORAL
  Filled 2021-05-12 (×4): qty 2
  Filled 2021-05-12 (×2): qty 8

## 2021-05-12 MED ORDER — SODIUM CHLORIDE 0.9 % IV SOLN
1.0000 g | Freq: Once | INTRAVENOUS | Status: AC
  Administered 2021-05-12: 1 g via INTRAVENOUS
  Filled 2021-05-12: qty 10

## 2021-05-12 MED ORDER — SODIUM CHLORIDE 0.9 % IV SOLN: INTRAVENOUS | Status: DC

## 2021-05-12 MED ORDER — SODIUM CHLORIDE 0.9 % IV SOLN
500.0000 mg | INTRAVENOUS | Status: DC
  Filled 2021-05-12: qty 500

## 2021-05-12 MED ORDER — ACETAMINOPHEN 325 MG PO TABS
650.0000 mg | ORAL_TABLET | Freq: Four times a day (QID) | ORAL | Status: DC | PRN
  Administered 2021-05-13 – 2021-05-14 (×2): 650 mg via ORAL
  Filled 2021-05-12 (×2): qty 2

## 2021-05-12 MED ORDER — ACETAMINOPHEN 650 MG RE SUPP: 650.0000 mg | Freq: Four times a day (QID) | RECTAL | Status: DC | PRN

## 2021-05-12 MED ORDER — ENOXAPARIN SODIUM 40 MG/0.4ML IJ SOSY
40.0000 mg | PREFILLED_SYRINGE | INTRAMUSCULAR | Status: DC
  Administered 2021-05-12 – 2021-05-14 (×3): 40 mg via SUBCUTANEOUS
  Filled 2021-05-12 (×3): qty 0.4

## 2021-05-12 MED ORDER — ASPIRIN EC 81 MG PO TBEC
81.0000 mg | DELAYED_RELEASE_TABLET | Freq: Every day | ORAL | Status: DC
  Administered 2021-05-13 – 2021-05-14 (×2): 81 mg via ORAL
  Filled 2021-05-12 (×2): qty 1

## 2021-05-12 MED ORDER — SODIUM CHLORIDE 0.9 % IV SOLN
500.0000 mg | Freq: Once | INTRAVENOUS | Status: AC
  Administered 2021-05-12: 500 mg via INTRAVENOUS
  Filled 2021-05-12: qty 500

## 2021-05-12 MED ORDER — GABAPENTIN 800 MG PO TABS
800.0000 mg | ORAL_TABLET | Freq: Three times a day (TID) | ORAL | Status: DC
  Filled 2021-05-12 (×2): qty 1

## 2021-05-12 MED ORDER — LACTATED RINGERS IV BOLUS
1000.0000 mL | Freq: Once | INTRAVENOUS | Status: AC
  Administered 2021-05-12: 1000 mL via INTRAVENOUS

## 2021-05-12 MED ORDER — FLUTICASONE PROPIONATE 50 MCG/ACT NA SUSP
1.0000 | Freq: Every day | NASAL | Status: DC
  Administered 2021-05-13 – 2021-05-14 (×2): 1 via NASAL
  Filled 2021-05-12: qty 16

## 2021-05-12 MED ORDER — AMLODIPINE BESYLATE 5 MG PO TABS: 2.5000 mg | ORAL_TABLET | Freq: Every day | ORAL | Status: DC

## 2021-05-12 MED ORDER — ONDANSETRON HCL 4 MG PO TABS: 4.0000 mg | ORAL_TABLET | Freq: Four times a day (QID) | ORAL | Status: DC | PRN

## 2021-05-12 MED ORDER — ATORVASTATIN CALCIUM 20 MG PO TABS
40.0000 mg | ORAL_TABLET | Freq: Every day | ORAL | Status: DC
  Administered 2021-05-13 – 2021-05-14 (×2): 40 mg via ORAL
  Filled 2021-05-12 (×3): qty 2

## 2021-05-12 MED ORDER — ONDANSETRON HCL 4 MG/2ML IJ SOLN: 4.0000 mg | Freq: Four times a day (QID) | INTRAMUSCULAR | Status: DC | PRN

## 2021-05-12 MED ORDER — BENZONATATE 100 MG PO CAPS
100.0000 mg | ORAL_CAPSULE | Freq: Three times a day (TID) | ORAL | Status: DC
  Administered 2021-05-12 – 2021-05-15 (×8): 100 mg via ORAL
  Filled 2021-05-12 (×8): qty 1

## 2021-05-12 NOTE — ED Notes (Signed)
Dr Derrill Kay notified of temp. No new orders given. Abx infusing.

## 2021-05-12 NOTE — ED Triage Notes (Signed)
Pt to ED via POV with c/o pneumonia. Pt states was seen at [facility] and dx with pneumonia earlier today. Pt states cold like symptoms and cough x 1 week.

## 2021-05-12 NOTE — H&P (Signed)
History and Physical   Emily Rivas GBE:[id number] DOB: 10/04/1929 DOA: 05/12/2021  PCP: Leim Fabry, MD  Outpatient Specialists: Dr. Gilda Crease, vascular Patient coming from: Urgent care  I have personally briefly reviewed patient's old medical records in [facility] EMR.  Chief Concern: Cough  HPI: Emily Rivas is a 87 y.o. female with medical history significant for Hypertension, hyperlipidemia, peripheral neuropathy, presents to the emergency department for chief concerns of cough.  She reports that she has been coughing for approximately 1.5 weeks.  She denies associated shortness of breath, chest pain, abdominal pain.  She endorses fever at home however the temperature reading at home per family was 99.7.  Patient and daughter at bedside reports that she has been close with her grandson who is 51 years old who has been experiencing cough and congestion for several weeks.  At home patient attempted to take Mucinex which minimally improved her symptoms.  Of note patient lives at home with her sister who is 71 years old and her grandson who is 38 years old.  She reports that she has right lower extremity swelling that is improved with compression stockings.  She presented to her vascular provider in April and they ordered ultrasound of the right lower extremity was negative for DVT.  Social history: She lives with grandson and sister who is 61 yo. She denies etoh, recreational drug use, and tobacco use. She formerly worked in Chief Strategy Officer at [employer].  Vaccinations: 2 doses of covid 19, pfizer  ROS: Constitutional: no weight change, + fever ENT/Mouth: no sore throat, no rhinorrhea Eyes: no eye pain, no vision changes Cardiovascular: no chest pain, no dyspnea,  no edema, no palpitations Respiratory: + cough, no sputum, no wheezing Gastrointestinal: no nausea, no vomiting, no diarrhea, no constipation Genitourinary: no urinary incontinence, no dysuria, no  hematuria Musculoskeletal: no arthralgias, no myalgias Skin: no skin lesions, no pruritus, Neuro: + weakness, no loss of consciousness, no syncope Psych: no anxiety, no depression, + decrease appetite Heme/Lymph: no bruising, no bleeding  ED Course: Discussed with ED provider, patient requiring hospitalization for pneumonia.  Vitals in the emergency department was remarkable for temperature of 103.2 which improved to 101, respiration rate of 24, heart rate of 98, blood pressure 105/88, SPO2 of 96% on room air.  Labs at the urgent care was remarkable for sodium of 132, nonfasting blood glucose 144, BUN of 18, serum creatinine of 0.65, WBC of 24.2, hemoglobin 13, platelets 199.  COVID antigen at the urgent care center was negative.  Patient is status post azithromycin 500 mg IV, ceftriaxone 1 g IV, lactated Ringer's 1 L bolus per EDP.  Assessment/Plan  Principal Problem:   PNA (pneumonia) Active Problems:   Osteoarthrosis   Depressive disorder   Esophageal reflux   Essential hypertension   Hyperlipidemia   Insomnia   Neuropathy   PAD (peripheral artery disease) (HCC)   # Cough likely secondary to the left lower lobe pneumonia - Recent sick contact and via grandson at home - Status post ceftriaxone and azithromycin IV per EDP - We will continue azithromycin 500 mg IV and ceftriaxone 1 g IV daily starting on 05/13/2021, 2 additional days ordered - Mucinex for daytime cough expectorants and Tessalon capsule every 8 hours - Flonase 1 spray per naris - Tussionex nightly for cough suppression to allow patient to sleep - Incentive spirometry, flutter valve  # Hypertension-amlodipine 10 mg daily resumed for 05/13/2021  # Hyperlipidemia-atorvastatin 40 mg nightly resumed  # Peripheral  neuropathy-gabapentin 800 mg 3 times daily -Patient and family is aware that this is a high dose and states that they have been tried on the 300 mg dosing and so forth with minimal improvement  COVID PCR  is pending  Chart reviewed.   DVT prophylaxis: Enoxaparin 40 mg subcutaneous every 24 hours Code Status: Full code Diet: Heart healthy Family Communication: Updated daughter at bedside Disposition Plan: Pending clinical course Consults called: None at this time Admission status: Observation, MedSurg, with telemetry for 24 hours  Past Medical History:  Diagnosis Date  . Arthritis   . Diverticulitis   . Glaucoma   . Hypercholesteremia   . Hyperlipidemia   . Hypertension   . Insomnia   . Osteoporosis   . PVC (premature ventricular contraction)    Past Surgical History:  Procedure Laterality Date  . ABDOMINAL HYSTERECTOMY    . APPENDECTOMY    . EYE SURGERY    . HERNIA REPAIR    . OOPHORECTOMY     Social History:  reports that she has never smoked. She has never used smokeless tobacco. She reports that she does not drink alcohol and does not use drugs.  Allergies  Allergen Reactions  . Ace Inhibitors Cough  . Amoxicillin Swelling  . Lisinopril Swelling  . Ciprofloxacin Rash   Family History  Problem Relation Age of Onset  . Liver cancer Mother   . Cirrhosis Mother   . Heart attack Father   . Breast cancer Neg Hx    Family history: Family history reviewed and not pertinent  Prior to Admission medications   Medication Sig Start Date End Date Taking? Authorizing Provider  amLODipine (NORVASC) 2.5 MG tablet Take 2.5 mg by mouth daily.    [provider]  aspirin EC 81 MG tablet Take 81 mg by mouth daily.    [provider]  atorvastatin (LIPITOR) 40 MG tablet Take 40 mg by mouth daily.    [provider]  benzonatate (TESSALON) 100 MG capsule Take 1 capsule (100 mg total) by mouth every 8 (eight) hours. 11/02/20   Bailey Mech, NP  calcium-vitamin D (OSCAL WITH D) 500-200 MG-UNIT tablet Take 1 tablet by mouth.    [provider]  diclofenac sodium (VOLTAREN) 1 % GEL Apply topically 4 (four) times daily.    [provider]  fexofenadine (ALLEGRA) 180 MG tablet Take 1 tablet (180 mg total) by mouth daily. 11/02/20 12/02/20  Bailey Mech, NP  fluticasone (FLONASE) 50 MCG/ACT nasal spray Place 1 spray into both nostrils daily.    [provider]  gabapentin (NEURONTIN) 800 MG tablet Take 800 mg by mouth 3 (three) times daily.    [provider]  guaiFENesin-dextromethorphan (ROBITUSSIN DM) 100-10 MG/5ML syrup Take 5 mLs by mouth every 4 (four) hours as needed for cough. 11/02/20   Bailey Mech, NP  Multiple Vitamin (MULTIVITAMIN) tablet Take 1 tablet by mouth daily.    [provider]  polyethylene glycol (MIRALAX / GLYCOLAX) packet Take 17 g by mouth daily.    [provider]  traZODone (DESYREL) 50 MG tablet Take 50 mg by mouth at bedtime.    [provider]   Physical Exam: Vitals:   05/12/21 1900 05/12/21 1925 05/12/21 2000 05/12/21 2002  BP: 138/61  129/76   Pulse: 97 98 100   Resp: (!) 24 (!) 24 20   Temp:    100.1 F (37.8 C)  TempSrc:    Oral  SpO2: 94% 96% 94%  Weight:      Height:       Constitutional: appears age-appropriate, NAD, calm, comfortable Eyes: PERRL, lids and conjunctivae normal ENMT: Mucous membranes are moist. Posterior pharynx clear of any exudate or lesions. Age-appropriate dentition.  Mild hearing loss Neck: normal, supple, no masses, no thyromegaly Respiratory: clear to auscultation bilaterally, no wheezing, no crackles. Normal respiratory effort. No accessory muscle use.  Cardiovascular: Regular rate and rhythm, no murmurs / rubs / gallops. No extremity edema. 2+ pedal pulses. No carotid bruits.  Abdomen: no tenderness, no masses palpated, no hepatosplenomegaly. Bowel sounds positive.  Musculoskeletal: no clubbing / cyanosis. No joint deformity upper and lower extremities. Good ROM, no contractures, no atrophy. Normal muscle tone.  Skin: no rashes, lesions, ulcers. No induration Neurologic: Sensation intact. Strength 5/5  in all 4.  Psychiatric: Normal judgment and insight. Alert and oriented x 3. Normal mood.   EKG: Not indicated  Chest x-ray on Admission: I personally reviewed and I agree with radiologist reading as below.  DG Chest 2 View  Result Date: 05/12/2021 CLINICAL DATA:  Cough and fever EXAM: CHEST - 2 VIEW COMPARISON:  1/30/9 FINDINGS: Normal heart size. No pleural effusion or interstitial edema. Retrocardiac opacity identified within the left lower lobe compatible with pneumonia. Right lung appears clear. Visualized osseous structures appear intact. IMPRESSION: Left lower lobe pneumonia. Electronically Signed   By: Signa Kell M.D.   On: 05/12/2021 17:33   Labs on Admission: I have personally reviewed following labs  CBC: Recent Labs  Lab 05/12/21 1701  WBC 24.2*  NEUTROABS 20.9*  HGB 13.0  HCT 38.6  MCV 87.1  PLT 199   Basic Metabolic Panel: Recent Labs  Lab 05/12/21 1701  NA 132*  K 4.0  CL 99  CO2 25  GLUCOSE 144*  BUN 18  CREATININE 0.65  CALCIUM 9.2   GFR: Estimated Creatinine Clearance: 42.2 mL/min (by C-G formula based on SCr of 0.65 mg/dL).  Urine analysis:    Component Value Date/Time   COLORURINE YELLOW 02/07/2018 0931   APPEARANCEUR CLEAR 02/07/2018 0931   APPEARANCEUR Clear 01/28/2014 2113   LABSPEC 1.015 02/07/2018 0931   LABSPEC 1.011 01/28/2014 2113   PHURINE 7.5 02/07/2018 0931   GLUCOSEU NEGATIVE 02/07/2018 0931   GLUCOSEU Negative 01/28/2014 2113   HGBUR NEGATIVE 02/07/2018 0931   BILIRUBINUR NEGATIVE 02/07/2018 0931   BILIRUBINUR Negative 01/28/2014 2113   KETONESUR NEGATIVE 02/07/2018 0931   PROTEINUR NEGATIVE 02/07/2018 0931   NITRITE NEGATIVE 02/07/2018 0931   LEUKOCYTESUR TRACE (A) 02/07/2018 0931   LEUKOCYTESUR 3+ 01/28/2014 2113   Burlin Mcnair N Shalita Notte D.O. [facility]  If 7PM-7AM, please contact overnight-coverage provider If 7AM-7PM, please contact day coverage provider [email or web address]  05/12/2021, 8:33 PM

## 2021-05-12 NOTE — ED Triage Notes (Signed)
Patient's daughter states that she has had a cough and congestion for 2 weeks.  Patient saw a provider at her PCP's office on Friday and was told she had a URI.  Patient states that she has chills and fever and her cough has not gotten any better.

## 2021-05-12 NOTE — ED Notes (Signed)
Patient is being discharged from the Urgent Care and sent to the Emergency Department via private vehicle with family member . Per Dr. Adriana Simas, patient is in need of higher level of care due to diagnosis of Pneumonia. Patient is aware and verbalizes understanding of plan of care.  Vitals:   05/12/21 1637  BP: (!) 153/65  Pulse: 95  Resp: 14  Temp: (!) 103.2 F (39.6 C)  SpO2: 96%

## 2021-05-12 NOTE — ED Notes (Signed)
Pt sts she is feeling better with oxygen applied.

## 2021-05-12 NOTE — ED Provider Notes (Signed)
[facility]    CSN: [id number] Arrival date & time: 05/12/21  1626      History   Chief Complaint Chief Complaint  Patient presents with  . Cough   HPI  87 year old female presents with cough and chills.  Patient states that she has been sick for nearly 2 weeks.  She has not been tested for COVID.  Recently saw her primary care physician on 6/3.  Diagnosed with a viral respiratory infection.  Patient states that she has a harsh cough which is productive.  She states that she woke up this morning and had chills.  She has had fatigue and decreased appetite.  She is currently febrile at 103.2.  She has had multiple recent sick contacts.  Feels very poorly.  No relieving factors.  Past Medical History:  Diagnosis Date  . Arthritis   . Diverticulitis   . Glaucoma   . Hypercholesteremia   . Hyperlipidemia   . Hypertension   . Insomnia   . Osteoporosis   . PVC (premature ventricular contraction)     Patient Active Problem List   Diagnosis Date Noted  . Hyperlipidemia 04/13/2021  . PAD (peripheral artery disease) (HCC) 04/13/2021  . Lymphedema 04/13/2021  . Cardiac murmur 03/12/2021  . Bilateral carotid artery stenosis 08/01/2018  . Frequent PVCs 02/25/2017  . Atypical chest pain 10/06/2016  . Osteoporosis 11/04/2015  . History of diverticulitis 07/09/2015  . Compression fracture 02/13/2015  . 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 Insomnia 03/27/2014  . Lower urinary tract infectious disease 03/27/2014  . Shoulder pain 03/27/2014  . Tinnitus 03/27/2014  . Constipation 10/15/2013  . Neuropathy 10/15/2013  . Carpal tunnel syndrome 07/20/2013  . Anxiety state 01/29/2013  . Esophageal reflux 01/05/2011  . Disorder of uterus 08/03/2000  . Glaucoma 08/03/2000  . Osteoarthrosis 07/11/2000  . Depressive disorder 07/11/2000  . Disorder of lipid metabolism 07/11/2000  . Essential hypertension 07/11/2000    Past Surgical History:   Procedure Laterality Date  . ABDOMINAL HYSTERECTOMY    . APPENDECTOMY    . EYE SURGERY    . HERNIA REPAIR    . OOPHORECTOMY      OB History   No obstetric history on file.      Home Medications    Prior to Admission medications   Medication Sig Start Date End Date Taking? Authorizing Provider  amLODipine (NORVASC) 2.5 MG tablet Take 2.5 mg by mouth daily.   Yes [provider]  aspirin EC 81 MG tablet Take 81 mg by mouth daily.   Yes [provider]  atorvastatin (LIPITOR) 40 MG tablet Take 40 mg by mouth daily.   Yes [provider]  calcium-vitamin D (OSCAL WITH D) 500-200 MG-UNIT tablet Take 1 tablet by mouth.   Yes [provider]  fluticasone (FLONASE) 50 MCG/ACT nasal spray Place 1 spray into both nostrils daily.   Yes [provider]  gabapentin (NEURONTIN) 800 MG tablet Take 800 mg by mouth 3 (three) times daily.   Yes [provider]  Multiple Vitamin (MULTIVITAMIN) tablet Take 1 tablet by mouth daily.   Yes [provider]  traZODone (DESYREL) 50 MG tablet Take 50 mg by mouth at bedtime.   Yes [provider]  benzonatate (TESSALON) 100 MG capsule Take 1 capsule (100 mg total) by mouth every 8 (eight) hours. 11/02/20   Bailey Mech, NP  diclofenac sodium (VOLTAREN) 1 % GEL Apply topically 4 (four) times daily.  [provider]  fexofenadine (ALLEGRA) 180 MG tablet Take 1 tablet (180 mg total) by mouth daily. 11/02/20 12/02/20  Bailey Mech, NP  guaiFENesin-dextromethorphan (ROBITUSSIN DM) 100-10 MG/5ML syrup Take 5 mLs by mouth every 4 (four) hours as needed for cough. 11/02/20   Bailey Mech, NP  polyethylene glycol (MIRALAX / GLYCOLAX) packet Take 17 g by mouth daily.    [provider]    Family History Family History  Problem Relation Age of Onset  . Liver cancer Mother   . Cirrhosis Mother   . Heart attack Father   . Breast cancer Neg Hx     Social  History Social History   Tobacco Use  . Smoking status: Never Smoker  . Smokeless tobacco: Never Used  Vaping Use  . Vaping Use: Never used  Substance Use Topics  . Alcohol use: No  . Drug use: No     Allergies   Ace inhibitors, Amoxicillin, Lisinopril, and Ciprofloxacin   Review of Systems Review of Systems Per HPI  Physical Exam Triage Vital Signs ED Triage Vitals [05/12/21 1637]  Enc Vitals Group     BP (!) 153/65     Pulse Rate 95     Resp 14     Temp (!) 103.2 F (39.6 C)     Temp Source Oral     SpO2 96 %     Weight 164 lb 14.5 oz (74.8 kg)     Height      Head Circumference      Peak Flow      Pain Score 0     Pain Loc      Pain Edu?      Excl. in GC?    Updated Vital Signs BP (!) 153/65 (BP Location: Right Arm)   Pulse 95   Temp (!) 103.2 F (39.6 C) (Oral) Comment: Patient states that she took Tylenol 2 hours ago today  Resp 14   Wt 74.8 kg   SpO2 96%   BMI 32.21 kg/m   Visual Acuity Right Eye Distance:   Left Eye Distance:   Bilateral Distance:    Right Eye Near:   Left Eye Near:    Bilateral Near:     Physical Exam Vitals and nursing note reviewed.  Constitutional:      Appearance: She is ill-appearing.     Comments: Appears fatigued.  HENT:     Head: Normocephalic and atraumatic.     Mouth/Throat:     Pharynx: Oropharynx is clear.  Eyes:     General:        Right eye: No discharge.        Left eye: No discharge.     Conjunctiva/sclera: Conjunctivae normal.  Cardiovascular:     Rate and Rhythm: Normal rate and regular rhythm.  Pulmonary:     Effort: Pulmonary effort is normal.     Breath sounds: Rales present.  Psychiatric:        Mood and Affect: Mood normal.        Behavior: Behavior normal.    UC Treatments / Results  Labs (all labs ordered are listed, but only abnormal results are displayed) Labs Reviewed  CBC WITH DIFFERENTIAL/PLATELET - Abnormal; Notable for the following components:      Result Value   WBC  24.2 (*)    Neutro Abs 20.9 (*)    Monocytes Absolute 1.5 (*)    Abs Immature Granulocytes 0.15 (*)    All other components within  normal limits  BASIC METABOLIC PANEL - Abnormal; Notable for the following components:   Sodium 132 (*)    Glucose, Bld 144 (*)    All other components within normal limits  INFLUENZA A AND B ANTIGEN (CONVERTED LAB)  POC INFLUENZA A AND B ANTIGEN (URGENT CARE ONLY)  POC SARS CORONAVIRUS 2 AG    EKG   Radiology DG Chest 2 View  Result Date: 05/12/2021 CLINICAL DATA:  Cough and fever EXAM: CHEST - 2 VIEW COMPARISON:  1/30/9 FINDINGS: Normal heart size. No pleural effusion or interstitial edema. Retrocardiac opacity identified within the left lower lobe compatible with pneumonia. Right lung appears clear. Visualized osseous structures appear intact. IMPRESSION: Left lower lobe pneumonia. Electronically Signed   By: Signa Kell M.D.   On: 05/12/2021 17:33    Procedures Procedures (including critical care time)  Medications Ordered in UC Medications - No data to display  Initial Impression / Assessment and Plan / UC Course  I have reviewed the triage vital signs and the nursing notes.  Pertinent labs & imaging results that were available during my care of the patient were reviewed by me and considered in my medical decision making (see chart for details).    87 year old female presents with fever and respiratory symptoms.  Patient is ill-appearing.  Chest x-ray was obtained and revealed left lower lobe pneumonia.  Laboratory studies notable for leukocytosis of 24.1.  Mild hyponatremia with a sodium of 132.  Rapid COVID and rapid flu testing negative.  Given patient's age and clinical picture, I feel that she needs hospitalization.  Needs IV antibiotics, IV fluids, sepsis work-up.  Patient is hemodynamically stable at this time.  We discussed going to the hospital in an ambulance versus going via private vehicle.  Daughter is comfortable taking her directly  to [facility] ER via private vehicle.  ER nurse, Herbert Seta notified.  Final Clinical Impressions(s) / UC Diagnoses   Final diagnoses:  Community acquired pneumonia of left lower lobe of lung  Leukocytosis, unspecified type  Hyponatremia   Discharge Instructions   None    ED Prescriptions    None     PDMP not reviewed this encounter.   Tommie Sams, [place] 05/12/21 1803

## 2021-05-12 NOTE — ED Provider Notes (Signed)
[facility] Emergency Department Provider Note   ____________________________________________   I have reviewed the triage vital signs and the nursing notes.   HISTORY  Chief Complaint Cough   History limited by: Not Limited   HPI Emily Rivas is a 87 y.o. female who presents to the emergency department today from urgent care after diagnosis of pneumonia. Patient has been having issues with cough and some shortness of breath for roughly 2 weeks. Had been around a sick family member when it started. The symptoms have been fairly constant since then. She has also started developing chills. The patient has been trying over the counter medications without any significant relief. The patient was seen at urgent care today where workup was concerning for pneumonia.     Records reviewed. Per medical record review patient has a history of HLD, HTN.   Past Medical History:  Diagnosis Date  . Arthritis   . Diverticulitis   . Glaucoma   . Hypercholesteremia   . Hyperlipidemia   . Hypertension   . Insomnia   . Osteoporosis   . PVC (premature ventricular contraction)     Patient Active Problem List   Diagnosis Date Noted  . Hyperlipidemia 04/13/2021  . PAD (peripheral artery disease) (HCC) 04/13/2021  . Lymphedema 04/13/2021  . Cardiac murmur 03/12/2021  . Bilateral carotid artery stenosis 08/01/2018  . Frequent PVCs 02/25/2017  . Atypical chest pain 10/06/2016  . Osteoporosis 11/04/2015  . History of diverticulitis 07/09/2015  . Compression fracture 02/13/2015  . 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 Insomnia 03/27/2014  . Lower urinary tract infectious disease 03/27/2014  . Shoulder pain 03/27/2014  . Tinnitus 03/27/2014  . Constipation 10/15/2013  . Neuropathy 10/15/2013  . Carpal tunnel syndrome 07/20/2013  . Anxiety state 01/29/2013  . Esophageal reflux 01/05/2011  . Disorder of uterus 08/03/2000  . Glaucoma  08/03/2000  . Osteoarthrosis 07/11/2000  . Depressive disorder 07/11/2000  . Disorder of lipid metabolism 07/11/2000  . Essential hypertension 07/11/2000    Past Surgical History:  Procedure Laterality Date  . ABDOMINAL HYSTERECTOMY    . APPENDECTOMY    . EYE SURGERY    . HERNIA REPAIR    . OOPHORECTOMY      Prior to Admission medications   Medication Sig Start Date End Date Taking? Authorizing Provider  amLODipine (NORVASC) 2.5 MG tablet Take 2.5 mg by mouth daily.    [provider]  aspirin EC 81 MG tablet Take 81 mg by mouth daily.    [provider]  atorvastatin (LIPITOR) 40 MG tablet Take 40 mg by mouth daily.    [provider]  benzonatate (TESSALON) 100 MG capsule Take 1 capsule (100 mg total) by mouth every 8 (eight) hours. 11/02/20   Bailey Mech, NP  calcium-vitamin D (OSCAL WITH D) 500-200 MG-UNIT tablet Take 1 tablet by mouth.    [provider]  diclofenac sodium (VOLTAREN) 1 % GEL Apply topically 4 (four) times daily.    [provider]  fexofenadine (ALLEGRA) 180 MG tablet Take 1 tablet (180 mg total) by mouth daily. 11/02/20 12/02/20  Bailey Mech, NP  fluticasone (FLONASE) 50 MCG/ACT nasal spray Place 1 spray into both nostrils daily.    [provider]  gabapentin (NEURONTIN) 800 MG tablet Take 800 mg by mouth 3 (three) times daily.    [provider]  guaiFENesin-dextromethorphan (ROBITUSSIN DM) 100-10 MG/5ML syrup Take 5 mLs by mouth every 4 (four) hours as needed  for cough. 11/02/20   Bailey Mech, NP  Multiple Vitamin (MULTIVITAMIN) tablet Take 1 tablet by mouth daily.    [provider]  polyethylene glycol (MIRALAX / GLYCOLAX) packet Take 17 g by mouth daily.    [provider]  traZODone (DESYREL) 50 MG tablet Take 50 mg by mouth at bedtime.    [provider]    Allergies Ace inhibitors, Amoxicillin, Lisinopril, and Ciprofloxacin  Family History   Problem Relation Age of Onset  . Liver cancer Mother   . Cirrhosis Mother   . Heart attack Father   . Breast cancer Neg Hx     Social History Social History   Tobacco Use  . Smoking status: Never Smoker  . Smokeless tobacco: Never Used  Vaping Use  . Vaping Use: Never used  Substance Use Topics  . Alcohol use: No  . Drug use: No    Review of Systems Constitutional: Positive for chills.  Eyes: No visual changes. ENT: No sore throat. Cardiovascular: Denies chest pain. Respiratory: Positive for cough and shortness of breath.  Gastrointestinal: No abdominal pain.  No nausea, no vomiting.  No diarrhea.   Genitourinary: Negative for dysuria. Musculoskeletal: Negative for back pain. Skin: Negative for rash. Neurological: Negative for headaches, focal weakness or numbness.  ____________________________________________   PHYSICAL EXAM:  VITAL SIGNS: ED Triage Vitals  Enc Vitals Group     BP 05/12/21 1832 (!) 139/52     Pulse Rate 05/12/21 1832 93     Resp 05/12/21 1832 20     Temp 05/12/21 1832 99.6 F (37.6 C)     Temp Source 05/12/21 1832 Oral     SpO2 05/12/21 1832 91 %     Weight 05/12/21 1830 164 lb 14.5 oz (74.8 kg)     Height 05/12/21 1830 5' (1.524 m)     Head Circumference --      Peak Flow --      Pain Score 05/12/21 1828 0   Constitutional: Alert and oriented.  Eyes: Conjunctivae are normal.  ENT      Head: Normocephalic and atraumatic.      Nose: No congestion/rhinnorhea.      Mouth/Throat: Mucous membranes are moist.      Neck: No stridor. Hematological/Lymphatic/Immunilogical: No cervical lymphadenopathy. Cardiovascular: Normal rate, regular rhythm.  No murmurs, rubs, or gallops.  Respiratory: Normal respiratory effort without tachypnea nor retractions. Breath sounds are clear and equal bilaterally. No wheezes/rales/rhonchi. Gastrointestinal: Soft and non tender. No rebound. No guarding.  Genitourinary: Deferred Musculoskeletal: Normal range of  motion in all extremities. No lower extremity edema. Neurologic:  Normal speech and language. No gross focal neurologic deficits are appreciated.  Skin:  Skin is warm, dry and intact. No rash noted. Psychiatric: Mood and affect are normal. Speech and behavior are normal. Patient exhibits appropriate insight and judgment.  ____________________________________________    LABS (pertinent positives/negatives)  Labs sent from urgent care reviewed.   ____________________________________________   EKG  None  ____________________________________________    RADIOLOGY  CXR performed at urgent care reviewed  ____________________________________________   PROCEDURES  Procedures  ____________________________________________   INITIAL IMPRESSION / ASSESSMENT AND PLAN / ED COURSE  Pertinent labs & imaging results that were available during my care of the patient were reviewed by me and considered in my medical decision making (see chart for details).   Patient presents to the emergency department today from urgent care with diagnosis of pneumonia. Patient not requiring any oxygen here. Will start abx and plan  on admission.   ____________________________________________   FINAL CLINICAL IMPRESSION(S) / ED DIAGNOSES  Final diagnoses:  Community acquired pneumonia, unspecified laterality     Note: This dictation was prepared with Office manager. Any transcriptional errors that result from this process are unintentional     Phineas Semen, MD 05/12/21 1949

## 2021-05-13 DIAGNOSIS — J189 Pneumonia, unspecified organism: Secondary | ICD-10-CM | POA: Diagnosis not present

## 2021-05-13 DIAGNOSIS — I10 Essential (primary) hypertension: Secondary | ICD-10-CM | POA: Diagnosis not present

## 2021-05-13 DIAGNOSIS — D72828 Other elevated white blood cell count: Secondary | ICD-10-CM | POA: Diagnosis not present

## 2021-05-13 LAB — BASIC METABOLIC PANEL
Anion gap: 4 — ABNORMAL LOW (ref 5–15)
BUN: 16 mg/dL (ref 8–23)
CO2: 24 mmol/L (ref 22–32)
Calcium: 8.1 mg/dL — ABNORMAL LOW (ref 8.9–10.3)
Chloride: 105 mmol/L (ref 98–111)
Creatinine, Ser: 0.59 mg/dL (ref 0.44–1.00)
GFR, Estimated: >60 mL/min (ref >60)
Glucose, Bld: 127 mg/dL — ABNORMAL HIGH (ref 70–99)
Potassium: 3.5 mmol/L (ref 3.5–5.1)
Sodium: 133 mmol/L — ABNORMAL LOW (ref 135–145)

## 2021-05-13 LAB — CBC
HCT: 30.4 % — ABNORMAL LOW (ref 36.0–46.0)
Hemoglobin: 10.5 g/dL — ABNORMAL LOW (ref 12.0–15.0)
MCH: 30.4 pg (ref 26.0–34.0)
MCHC: 34.5 g/dL (ref 30.0–36.0)
MCV: 88.1 fL (ref 80.0–100.0)
Platelets: 158 10*3/uL (ref 150–400)
RBC: 3.45 MIL/uL — ABNORMAL LOW (ref 3.87–5.11)
RDW: 13.5 % (ref 11.5–15.5)
WBC: 20.6 10*3/uL — ABNORMAL HIGH (ref 4.0–10.5)
nRBC: 0 % (ref 0.0–0.2)

## 2021-05-13 LAB — LACTIC ACID, PLASMA: Lactic Acid, Venous: 1.4 mmol/L (ref 0.5–1.9)

## 2021-05-13 MED ORDER — SODIUM CHLORIDE 0.9 % IV SOLN
1.0000 g | INTRAVENOUS | Status: DC
  Administered 2021-05-13 – 2021-05-14 (×2): 1 g via INTRAVENOUS
  Filled 2021-05-13 (×2): qty 1

## 2021-05-13 MED ORDER — GABAPENTIN 300 MG PO CAPS
800.0000 mg | ORAL_CAPSULE | Freq: Two times a day (BID) | ORAL | Status: DC
  Administered 2021-05-13 – 2021-05-14 (×3): 800 mg via ORAL
  Filled 2021-05-13 (×2): qty 2

## 2021-05-13 MED ORDER — IPRATROPIUM-ALBUTEROL 0.5-2.5 (3) MG/3ML IN SOLN: 3.0000 mL | Freq: Four times a day (QID) | RESPIRATORY_TRACT | Status: DC | PRN

## 2021-05-13 MED ORDER — POLYETHYLENE GLYCOL 3350 17 G PO PACK
17.0000 g | PACK | Freq: Every day | ORAL | Status: DC
  Filled 2021-05-13: qty 1

## 2021-05-13 MED ORDER — SODIUM CHLORIDE 0.9 % IV SOLN
500.0000 mg | INTRAVENOUS | Status: DC
  Administered 2021-05-13: 21:00:00 500 mg via INTRAVENOUS
  Filled 2021-05-13 (×2): qty 500

## 2021-05-13 NOTE — Progress Notes (Signed)
PROGRESS NOTE   HPI was taken from Dr. Sedalia Muta:  Emily Rivas is a 87 y.o. female with medical history significant for Hypertension, hyperlipidemia, peripheral neuropathy, presents to the emergency department for chief concerns of cough.  She reports that she has been coughing for approximately 1.5 weeks.  She denies associated shortness of breath, chest pain, abdominal pain.  She endorses fever at home however the temperature reading at home per family was 99.7.  Patient and daughter at bedside reports that she has been close with her grandson who is 31 years old who has been experiencing cough and congestion for several weeks.  At home patient attempted to take Mucinex which minimally improved her symptoms.  Of note patient lives at home with her sister who is 26 years old and her grandson who is 63 years old.  She reports that she has right lower extremity swelling that is improved with compression stockings.  She presented to her vascular provider in April and they ordered ultrasound of the right lower extremity was negative for DVT.  Social history: She lives with grandson and sister who is 26 yo. She denies etoh, recreational drug use, and tobacco use. She formerly worked in Chief Strategy Officer at [employer].  Vaccinations: 2 doses of covid [address]   Emily Rivas  DPO:[id number] DOB: 23-Jan-1929 DOA: 05/12/2021 PCP: Leim Fabry, MD    Assessment & Plan:   Principal Problem:   PNA (pneumonia) Active Problems:   Osteoarthrosis   Depressive disorder   Esophageal reflux   Essential hypertension   Hyperlipidemia   Insomnia   Neuropathy   PAD (peripheral artery disease) (HCC)   Left lower lobe pneumonia: continue on IV ceftriaxone, azithromycin. Continue on bronchodilators and encourage incentive spirometry. Tessalon, tussionex prn for cough  Leukocytosis: quite elevated but trending down slightly today. Likely secondary to pneumonia. Continue on IV abxs   HTN:  continue on amlodipine   HLD: continue on statin  Peripheral neuropathy: continue home dose of gabapentin  Hyponatremia: trending up today. Will continue to monitor    DVT prophylaxis: lovenox  Code Status: full Family Communication: discussed pt's care w/ pt's daughter and answered her questions Disposition Plan: likely d/c back home   Level of care: Med-Surg   Status is: Observation  The patient remains OBS appropriate and will d/c before 2 midnights.  Dispo: The patient is from: Home              Anticipated d/c is to: Home              Patient currently is not medically stable to d/c.   Difficult to place patient: unclear   Consultants:      Procedures:    Antimicrobials: azithromycin, ceftriaxone   Subjective: Pt c/o cough   Objective: Vitals:   05/12/21 2320 05/12/21 2330 05/13/21 0040 05/13/21 0347  BP:  105/63 134/62 (!) 112/52  Pulse: 82 81 79 71  Resp: (!) 21 (!) 21 18 20   Temp: 99.6 F (37.6 C)  99.3 F (37.4 C) 98.6 F (37 C)  TempSrc: Oral  Oral Oral  SpO2: 93% 92% 95% 92%  Weight:   72 kg   Height:   5\' 4"  (1.626 m)     Intake/Output Summary (Last 24 hours) at 05/13/2021 0736 Last data filed at 05/13/2021 0400 Gross per 24 hour  Intake 2116.57 ml  Output --  Net 2116.57 ml   Filed Weights   05/12/21 1830 05/13/21 0040  Weight:  74.8 kg 72 kg    Examination:  General exam: Appears calm and comfortable  Respiratory system: diminished breath sounds b/l Cardiovascular system: S1 & S2+. No rubs, gallops or clicks. Gastrointestinal system: Abdomen is nondistended, soft and nontender. Normal bowel sounds heard. Central nervous system: Alert and oriented. Moves all extremities  Psychiatry: Judgement and insight appear normal. Mood & affect appropriate.     Data Reviewed: I have personally reviewed following labs and imaging studies  CBC: Recent Labs  Lab 05/12/21 1701 05/13/21 0435  WBC 24.2* 20.6*  NEUTROABS 20.9*  --   HGB  13.0 10.5*  HCT 38.6 30.4*  MCV 87.1 88.1  PLT 199 158   Basic Metabolic Panel: Recent Labs  Lab 05/12/21 1701 05/13/21 0435  NA 132* 133*  K 4.0 3.5  CL 99 105  CO2 25 24  GLUCOSE 144* 127*  BUN 18 16  CREATININE 0.65 0.59  CALCIUM 9.2 8.1*   GFR: Estimated Creatinine Clearance: 45.5 mL/min (by C-G formula based on SCr of 0.59 mg/dL). Liver Function Tests: No results for input(s): AST, ALT, ALKPHOS, BILITOT, PROT, ALBUMIN in the last 168 hours. No results for input(s): LIPASE, AMYLASE in the last 168 hours. No results for input(s): AMMONIA in the last 168 hours. Coagulation Profile: No results for input(s): INR, PROTIME in the last 168 hours. Cardiac Enzymes: No results for input(s): CKTOTAL, CKMB, CKMBINDEX, TROPONINI in the last 168 hours. BNP (last 3 results) No results for input(s): PROBNP in the last 8760 hours. HbA1C: No results for input(s): HGBA1C in the last 72 hours. CBG: No results for input(s): GLUCAP in the last 168 hours. Lipid Profile: No results for input(s): CHOL, HDL, LDLCALC, TRIG, CHOLHDL, LDLDIRECT in the last 72 hours. Thyroid Function Tests: No results for input(s): TSH, T4TOTAL, FREET4, T3FREE, THYROIDAB in the last 72 hours. Anemia Panel: No results for input(s): VITAMINB12, FOLATE, FERRITIN, TIBC, IRON, RETICCTPCT in the last 72 hours. Sepsis Labs: Recent Labs  Lab 05/12/21 1910 05/12/21 2319  LATICACIDVEN 1.0 1.4    Recent Results (from the past 240 hour(s))  Blood culture (routine x 2)     Status: None (Preliminary result)   Collection Time: 05/12/21  7:10 PM   Specimen: BLOOD  Result Value Ref Range Status   Specimen Description BLOOD RIGHT ANTECUBITAL  Final   Special Requests   Final    BOTTLES DRAWN AEROBIC AND ANAEROBIC Blood Culture adequate volume   Culture   Final    NO GROWTH < 12 HOURS Performed at [facility], [address] [place], [place] [address]    Report Status PENDING  Incomplete  Blood culture  (routine x 2)     Status: None (Preliminary result)   Collection Time: 05/12/21  7:10 PM   Specimen: BLOOD  Result Value Ref Range Status   Specimen Description BLOOD FOREARM  Final   Special Requests   Final    BOTTLES DRAWN AEROBIC AND ANAEROBIC Blood Culture adequate volume   Culture   Final    NO GROWTH < 12 HOURS Performed at [facility], [address], [place], [place] [address]    Report Status PENDING  Incomplete  Resp Panel by RT-PCR (Flu A&B, Covid) Nasopharyngeal Swab     Status: None   Collection Time: 05/12/21  7:10 PM   Specimen: Nasopharyngeal Swab; Nasopharyngeal(NP) swabs in vial transport medium  Result Value Ref Range Status   SARS Coronavirus 2 by RT PCR NEGATIVE NEGATIVE Final    Comment: (  NOTE) SARS-CoV-2 target nucleic acids are NOT DETECTED.  The SARS-CoV-2 RNA is generally detectable in upper respiratory specimens during the acute phase of infection. The lowest concentration of SARS-CoV-2 viral copies this assay can detect is 138 copies/mL. A negative result does not preclude SARS-Cov-2 infection and should not be used as the sole basis for treatment or other patient management decisions. A negative result may occur with  improper specimen collection/handling, submission of specimen other than nasopharyngeal swab, presence of viral mutation(s) within the areas targeted by this assay, and inadequate number of viral copies(<138 copies/mL). A negative result must be combined with clinical observations, patient history, and epidemiological information. The expected result is Negative.  Fact Sheet for Patients:  BloggerCourse.com  Fact Sheet for Healthcare Providers:  SeriousBroker.it  This test is no t yet approved or cleared by the [place] FDA and  has been authorized for detection and/or diagnosis of SARS-CoV-2 by FDA under an Emergency Use Authorization (EUA). This EUA will remain  in  effect (meaning this test can be used) for the duration of the COVID-19 declaration under Section 564(b)(1) of the Act, 21 U.S.C.section 360bbb-3(b)(1), unless the authorization is terminated  or revoked sooner.       Influenza A by PCR NEGATIVE NEGATIVE Final   Influenza B by PCR NEGATIVE NEGATIVE Final    Comment: (NOTE) The Xpert Xpress SARS-CoV-2/FLU/RSV plus assay is intended as an aid in the diagnosis of influenza from Nasopharyngeal swab specimens and should not be used as a sole basis for treatment. Nasal washings and aspirates are unacceptable for Xpert Xpress SARS-CoV-2/FLU/RSV testing.  Fact Sheet for Patients: BloggerCourse.com  Fact Sheet for Healthcare Providers: SeriousBroker.it  This test is not yet approved or cleared by the [place] FDA and has been authorized for detection and/or diagnosis of SARS-CoV-2 by FDA under an Emergency Use Authorization (EUA). This EUA will remain in effect (meaning this test can be used) for the duration of the COVID-19 declaration under Section 564(b)(1) of the Act, 21 U.S.C. section 360bbb-3(b)(1), unless the authorization is terminated or revoked.  Performed at [facility], [address] [place], [place] [address]          Radiology Studies: DG Chest 2 View  Result Date: 05/12/2021 CLINICAL DATA:  Cough and fever EXAM: CHEST - 2 VIEW COMPARISON:  1/30/9 FINDINGS: Normal heart size. No pleural effusion or interstitial edema. Retrocardiac opacity identified within the left lower lobe compatible with pneumonia. Right lung appears clear. Visualized osseous structures appear intact. IMPRESSION: Left lower lobe pneumonia. Electronically Signed   By: Signa Kell M.D.   On: 05/12/2021 17:33        Scheduled Meds: . amLODipine  10 mg Oral Daily  . aspirin EC  81 mg Oral Daily  . atorvastatin  40 mg Oral QHS  . benzonatate  100 mg Oral Q8H  .  chlorpheniramine-HYDROcodone  5 mL Oral QHS  . enoxaparin (LOVENOX) injection  40 mg Subcutaneous Q24H  . fluticasone  1 spray Each Nare Daily  . gabapentin  800 mg Oral TID  . guaiFENesin-dextromethorphan  10 mL Oral BID   Continuous Infusions: . azithromycin    . cefTRIAXone (ROCEPHIN)  IV       LOS: 0 days    Time spent: 67 mins    Charise Killian, MD [facility] Pager [phone]  If 7PM-7AM, please contact night-coverage 05/13/2021, 7:36 AM

## 2021-05-13 NOTE — Evaluation (Signed)
Occupational Therapy Evaluation Patient Details Name: Emily Rivas MRN: [id number] DOB: 1929-02-19 Today's Date: 05/13/2021    History of Present Illness Pt is a 87 y/o F with PMH: arthritis, HLD, glaucoma and HTN who went to urgent care with c/o SOB and cough x2 weeks. Pt was subsequently sent to [facility] ED d/t PNA.   Clinical Impression   Pt seen for OT evaluation this date in setting of acute hospitalization d/t PNA. Pt presents today reporting she is feeling better and would like to get up. Pt reports being INDEP at baseline including driving and running errands and reports using no AD for fxl mobility or ADLs. Pt presents this date with some decreased functional activity tolerance, but otherwise appears to be close to her functional baseline. Pt is able to come to EOB sitting and then standing with INDEP/MOD I for increased time, but no use of AD or LOB noted. Pt perform fxl mobility to/from restroom and commode transfer with SBA with OT purely managing lines/leads. Pt with good safety awareness and control of transfers. Pt performs standing bathing tasks sink-side with SETUP and no additional assistance. No further OT needs detected at this time as pt presents near her functional baseline and has very good social support. Will complete order at this time.    Follow Up Recommendations  No OT follow up    Equipment Recommendations  None recommended by OT    Recommendations for Other Services       Precautions / Restrictions Precautions Precautions: Fall Precaution Comments: low fall risk Restrictions Weight Bearing Restrictions: No      Mobility Bed Mobility Overal bed mobility: Independent                  Transfers Overall transfer level: Modified independent Equipment used: None             General transfer comment: increased time to come to stand    Balance Overall balance assessment: Mild deficits observed, not formally tested                                          ADL either performed or assessed with clinical judgement   ADL Overall ADL's : Modified independent;At baseline                                             Vision Patient Visual Report: No change from baseline       Perception     Praxis      Pertinent Vitals/Pain Pain Assessment: No/denies pain     Hand Dominance     Extremity/Trunk Assessment Upper Extremity Assessment Upper Extremity Assessment: Overall WFL for tasks assessed;Generalized weakness (ROM WFL, MMT grossly 4-/5)   Lower Extremity Assessment Lower Extremity Assessment: Overall WFL for tasks assessed;Generalized weakness       Communication Communication Communication: No difficulties   Cognition Arousal/Alertness: Awake/alert Behavior During Therapy: WFL for tasks assessed/performed Overall Cognitive Status: Within Functional Limits for tasks assessed                                     General Comments  some mild furniture cruising noted in the room. Pt states  that she prefers to wear shoes when walking for stability versus socks    Exercises Other Exercises Other Exercises: OT educates re: role and safety considerations with furniture cruising. Pt with good understanding.   Shoulder Instructions      Home Living Family/patient expects to be discharged to:: Private residence Living Arrangements: Other (Comment) (sister) Available Help at Discharge: Family;Available PRN/intermittently (dtr and son live close) Type of Home: House Home Access: Stairs to enter [employer] of Steps: 2-3 Entrance Stairs-Rails: Can reach both Home Layout: One level               Home Equipment: None          Prior Functioning/Environment Level of Independence: Independent        Comments: INDEP including driving and running her own errands. States that her sister is completed INDEP as well. Neither uses equipment.        OT  Problem List: Decreased activity tolerance;Cardiopulmonary status limiting activity      OT Treatment/Interventions: Self-care/ADL training;Therapeutic activities    OT Goals(Current goals can be found in the care plan section) Acute Rehab OT Goals Patient Stated Goal: to go home OT Goal Formulation: All assessment and education complete, DC therapy  OT Frequency:     Barriers to D/C:            Co-evaluation              AM-PAC OT "6 Clicks" Daily Activity     Outcome Measure Help from another person eating meals?: None Help from another person taking care of personal grooming?: None Help from another person toileting, which includes using toliet, bedpan, or urinal?: None Help from another person bathing (including washing, rinsing, drying)?: None Help from another person to put on and taking off regular upper body clothing?: None Help from another person to put on and taking off regular lower body clothing?: None 6 Click Score: 24   End of Session Equipment Utilized During Treatment: Gait belt Nurse Communication: Mobility status  Activity Tolerance: Patient tolerated treatment well Patient left: in chair;with call bell/phone within reach  OT Visit Diagnosis: Unsteadiness on feet (R26.81)                Time: 1950-9326 OT Time Calculation (min): 29 min Charges:  OT General Charges $OT Visit: 1 Visit OT Evaluation $OT Eval Low Complexity: 1 Low OT Treatments $Self Care/Home Management : 8-22 mins  Rejeana Brock, MS, OTR/L ascom [phone] 05/13/21, 3:56 PM

## 2021-05-13 NOTE — ED Notes (Signed)
Transport requested by secretary.  

## 2021-05-13 NOTE — Evaluation (Signed)
Physical Therapy Evaluation Patient Details Name: Emily Rivas MRN: [id number] DOB: March 08, 1929 Today's Date: 05/13/2021   History of Present Illness  Patient is a pleasant 87 year old female who presents for worsening cough. PMH HTN, HLD, peripheral neuorpathy, glaucoma, insomnia, osteoporosis, tinittis,and PVC. Patient lives at home with her grandson who is 85 years old and sister who is 61 years old.  Imaging negative for DVT.    Clinical Impression  Patient is a pleasant 87 year old female who presents with generalized weakness and deconditioning. Prior to hospital admission, pt was independent with ADLs and driving and lives with her sister as well as babysits her great grandchildren.  Currently pt is safe and functional with mobility but does have some strength deficits and limited capacity for prolonged mobility. Patient is in chair upon PT arrival and agreeable to physical therapy evaluation. She is alert and orient and very pleasant conversationalist. She was mod I with transition to standing without an AD and ambulated>200 ft without an AD. Patient reports her ambulation is usually better but she is not as comfortable ambulating without shoes on. Her gait speed is greater than 1.0 m/s and is functional. Patient returned to chair with CNA present for vitals and needs met.  Pt would benefit from skilled PT to address noted impairments and functional limitations (see below for any additional details).  Upon hospital discharge, pt would benefit from outpatient physical therapy to increase strength and return to PLOF.     Follow Up Recommendations Outpatient PT    Equipment Recommendations  None recommended by PT    Recommendations for Other Services       Precautions / Restrictions Precautions Precautions: Fall Precaution Comments: low fall risk Restrictions Weight Bearing Restrictions: No      Mobility  Bed Mobility Overal bed mobility: Independent             General bed  mobility comments: patient in chair upon PT arrival    Transfers Overall transfer level: Modified independent Equipment used: None             General transfer comment: sit to stand without assistance with slight increase in time.  Ambulation/Gait Ambulation/Gait assistance: Min guard;Supervision Gait Distance (Feet): 210 Feet Assistive device: None Gait Pattern/deviations: Narrow base of support;Shuffle     General Gait Details: Patient has slight shuffle steppage as she reports she does not like walking without shoes. Is safe and functional with gait speed >1.0 m/s  Stairs            Wheelchair Mobility    Modified Rankin (Stroke Patients Only)       Balance Overall balance assessment: Mild deficits observed, not formally tested;Needs assistance (able to static stand and reach without LOB.) Sitting-balance support: Feet unsupported;No upper extremity supported Sitting balance-Leahy Scale: Normal Sitting balance - Comments: reach and kick with LE's without support   Standing balance support: No upper extremity supported;During functional activity Standing balance-Leahy Scale: Good Standing balance comment: able to reach and throw out trash. No LOB                             Pertinent Vitals/Pain Pain Assessment: No/denies pain    Home Living Family/patient expects to be discharged to:: Private residence Living Arrangements: Other (Comment) (sister, and babysits great grandchildren) Available Help at Discharge: Family;Available PRN/intermittently (dtr and son live close) Type of Home: House Home Access: Stairs to enter Entrance Stairs-Rails: Can  reach both Entrance Stairs-Number of Steps: 2-3 Home Layout: One level Home Equipment: None Additional Comments: Patient reports shower is walk in, is independent without AD    Prior Function Level of Independence: Independent         Comments: INDEP including driving and running her own  errands. States that her sister is completed INDEP as well. Neither uses equipment.     Hand Dominance        Extremity/Trunk Assessment   Upper Extremity Assessment Upper Extremity Assessment: Defer to OT evaluation    Lower Extremity Assessment Lower Extremity Assessment: Overall WFL for tasks assessed;Generalized weakness (grossly 4/5 bilaterally with hamstrings 4-/5)       Communication   Communication: No difficulties  Cognition Arousal/Alertness: Awake/alert Behavior During Therapy: WFL for tasks assessed/performed Overall Cognitive Status: Within Functional Limits for tasks assessed                                 General Comments: A and O x4 ; very alert and eager to participate with therapy.      General Comments General comments (skin integrity, edema, etc.): patient reports she feels unsafe wearing just socks, prefers to have shoes for stability.    Exercises Other Exercises Other Exercises: Patient educated on role of PT in acute care setting; safe mobility and transfers for decreased fall risk .   Assessment/Plan    PT Assessment Patient needs continued PT services  PT Problem List Decreased strength;Decreased activity tolerance;Cardiopulmonary status limiting activity;Decreased balance       PT Treatment Interventions Stair training;Gait training;Therapeutic activities;Functional mobility training;Patient/family education;Neuromuscular re-education;Balance training;Therapeutic exercise;Modalities    PT Goals (Current goals can be found in the Care Plan section)  Acute Rehab PT Goals Patient Stated Goal: to return home PT Goal Formulation: With patient Time For Goal Achievement: 05/27/21 Potential to Achieve Goals: Fair    Frequency Min 2X/week   Barriers to discharge        Co-evaluation               AM-PAC PT "6 Clicks" Mobility  Outcome Measure Help needed turning from your back to your side while in a flat bed without  using bedrails?: None Help needed moving from lying on your back to sitting on the side of a flat bed without using bedrails?: None Help needed moving to and from a bed to a chair (including a wheelchair)?: None Help needed standing up from a chair using your arms (e.g., wheelchair or bedside chair)?: None Help needed to walk in hospital room?: None Help needed climbing 3-5 steps with a railing? : A Little 6 Click Score: 23    End of Session Equipment Utilized During Treatment: Gait belt Activity Tolerance: Patient tolerated treatment well Patient left: in chair;with nursing/sitter in room Nurse Communication: Mobility status PT Visit Diagnosis: Other abnormalities of gait and mobility (R26.89);Unsteadiness on feet (R26.81);Muscle weakness (generalized) (M62.81)    Time: 3383-2919 PT Time Calculation (min) (ACUTE ONLY): 10 min   Charges:   PT Evaluation $PT Eval Low Complexity: Terral, PT, DPT     05/13/2021, 4:38 PM

## 2021-05-13 NOTE — Progress Notes (Signed)
Received pt from the ED, transported via bed. Pt is aox4, denies any pain, VSS. Pt c/o of SOB on exertion. Maintained on 2lpm/[place]. Call bell placed within reach. Fluids offered. Orientation given and bed alarm activated.

## 2021-05-13 NOTE — Plan of Care (Signed)

## 2021-05-14 DIAGNOSIS — J189 Pneumonia, unspecified organism: Secondary | ICD-10-CM | POA: Diagnosis not present

## 2021-05-14 LAB — COMPREHENSIVE METABOLIC PANEL
ALT: 12 U/L (ref 0–44)
AST: 16 U/L (ref 15–41)
Albumin: 3.5 g/dL (ref 3.5–5.0)
Alkaline Phosphatase: 65 U/L (ref 38–126)
Anion gap: 8 (ref 5–15)
BUN: 14 mg/dL (ref 8–23)
CO2: 25 mmol/L (ref 22–32)
Calcium: 8.9 mg/dL (ref 8.9–10.3)
Chloride: 106 mmol/L (ref 98–111)
Creatinine, Ser: 0.65 mg/dL (ref 0.44–1.00)
GFR, Estimated: >60 mL/min (ref >60)
Glucose, Bld: 104 mg/dL — ABNORMAL HIGH (ref 70–99)
Potassium: 3.7 mmol/L (ref 3.5–5.1)
Sodium: 139 mmol/L (ref 135–145)
Total Bilirubin: 0.7 mg/dL (ref 0.3–1.2)
Total Protein: 6.5 g/dL (ref 6.5–8.1)

## 2021-05-14 LAB — CBC
HCT: 37.5 % (ref 36.0–46.0)
Hemoglobin: 12.9 g/dL (ref 12.0–15.0)
MCH: 30.1 pg (ref 26.0–34.0)
MCHC: 34.4 g/dL (ref 30.0–36.0)
MCV: 87.6 fL (ref 80.0–100.0)
Platelets: 204 10*3/uL (ref 150–400)
RBC: 4.28 MIL/uL (ref 3.87–5.11)
RDW: 13.3 % (ref 11.5–15.5)
WBC: 9.8 10*3/uL (ref 4.0–10.5)
nRBC: 0 % (ref 0.0–0.2)

## 2021-05-14 LAB — EXPECTORATED SPUTUM ASSESSMENT W GRAM STAIN, RFLX TO RESP C

## 2021-05-14 LAB — PROCALCITONIN: Procalcitonin: 0.42 ng/mL

## 2021-05-14 LAB — MAGNESIUM: Magnesium: 1.9 mg/dL (ref 1.7–2.4)

## 2021-05-14 LAB — PHOSPHORUS: Phosphorus: 2.6 mg/dL (ref 2.5–4.6)

## 2021-05-14 MED ORDER — AZITHROMYCIN 500 MG PO TABS
500.0000 mg | ORAL_TABLET | Freq: Every day | ORAL | Status: DC
  Administered 2021-05-14: 500 mg via ORAL
  Filled 2021-05-14: qty 1

## 2021-05-14 MED ORDER — TRAZODONE HCL 50 MG PO TABS
50.0000 mg | ORAL_TABLET | Freq: Every day | ORAL | Status: DC
  Administered 2021-05-14: 50 mg via ORAL
  Filled 2021-05-14: qty 1

## 2021-05-14 NOTE — TOC Progression Note (Signed)
Transition of Care [facility]) - Progression Note    Patient Details  Name: Emily Rivas MRN: [id number] Date of Birth: January 24, 1929  Transition of Care [facility]) CM/SW Contact  Caryn Section, RN Phone Number: 05/14/2021, 10:12 AM  Clinical Narrative:   RNCM in to see patient.  Patient lives with her sister, children live within a few miles of her home and can assist with care post discharge if/as needed.  Patient has no concerns about going home after discharge.  Patient does not use any DME or oxygen at home, and does not currently use Home Health Services.  Recommendation is for outpatient PT, patient is amenable to services and confirms transportation to and from services.  Denies further TOC needs at this time, TOC contact information given, TOC to follow to discharge for needs.    Expected Discharge Plan: Home/Self Care Barriers to Discharge: Continued Medical Work up  Expected Discharge Plan and Services Expected Discharge Plan: Home/Self Care   Discharge Planning Services: CM Consult Post Acute Care Choice: NA Living arrangements for the past 2 months: Single Family Home                                       Social Determinants of Health (SDOH) Interventions    Readmission Risk Interventions No flowsheet data found.

## 2021-05-14 NOTE — Progress Notes (Signed)
PROGRESS NOTE  Emily Rivas SNK:[id number] DOB: February 13, 1929 DOA: 05/12/2021 PCP: Leim Fabry, MD  HPI/Recap of past 24 hours: Emily Rivas is a 87 y.o. female with medical history significant for Hypertension, hyperlipidemia, peripheral neuropathy, presents to the emergency department for chief concerns of cough x 1.5 weeks.  Work-up revealed sepsis secondary to left lower lobe pneumonia for which she is on Rocephin and azithromycin.  COVID-19 screening test negative on 05/12/2021.  Vaccinations: 2 doses of covid 19, pfizer  05/14/21: Seen and examined reports persistent productive cough.  Assessment/Plan: Principal Problem:   PNA (pneumonia) Active Problems:   Osteoarthrosis   Depressive disorder   Esophageal reflux   Essential hypertension   Hyperlipidemia   Insomnia   Neuropathy   PAD (peripheral artery disease) (HCC)  Sepsis secondary to left lower lobe pneumonia, POA: She presented with leukocytosis 24K, fever with T-max 101.1, tachypnea respiration rate 24 with evidence of left lower lobe pneumonia on chest x-ray. She was started on IV Rocephin and IV azithromycin empirically, continue. Sputum culture has been sent for analysis. Continue on bronchodilators and encourage incentive spirometry.  Antitussives as needed   HTN:  BP stable. continue on amlodipine   HLD: Continue on statin   Peripheral neuropathy: continue home dose of gabapentin   Resolved hyponatremia: trending up today. Will continue to monitor      DVT prophylaxis: lovenox subcu daily. Code Status: full Family Communication: None at bedside.    Disposition Plan: likely d/c home on 05/15/2021.   Level of care: Med-Surg    Status is: Observation   The patient remains OBS appropriate and will d/c before 2 midnights.   Dispo: The patient is from: Home              Anticipated d/c is to: Home              Patient currently is not medically stable to d/c.              Difficult to place  patient: unclear     Consultants:  None   Procedures: None   Antimicrobials: azithromycin, ceftriaxone      Objective: Vitals:   05/14/21 0416 05/14/21 0734 05/14/21 1226 05/14/21 1603  BP: 127/65 (!) 148/62 (!) 142/78 (!) 141/56  Pulse: 75 70 82 83  Resp: 16 16 17 17   Temp: 98.1 F (36.7 C) 98.5 F (36.9 C) 98.6 F (37 C) 98.7 F (37.1 C)  TempSrc: Oral Oral Oral Oral  SpO2: 91% 94% 94% 94%  Weight:      Height:        Intake/Output Summary (Last 24 hours) at 05/14/2021 1635 Last data filed at 05/13/2021 1935 Gross per 24 hour  Intake 240 ml  Output --  Net 240 ml   Filed Weights   05/12/21 1830 05/13/21 0040  Weight: 74.8 kg 72 kg    Exam:  General: 87 y.o. year-old female well developed well nourished in no acute distress.  Alert and oriented x3. Cardiovascular: Regular rate and rhythm with no rubs or gallops.  No thyromegaly or JVD noted.   Respiratory: Clear to auscultation with no wheezes or rales. Good inspiratory effort. Abdomen: Soft nontender nondistended with normal bowel sounds x4 quadrants. Musculoskeletal: No lower extremity edema.  Skin: No ulcerative lesions noted or rashes, Psychiatry: Mood is appropriate for condition and setting   Data Reviewed: CBC: Recent Labs  Lab 05/12/21 1701 05/13/21 0435 05/14/21 1042  WBC 24.2* 20.6* 9.8  NEUTROABS  20.9*  --   --   HGB 13.0 10.5* 12.9  HCT 38.6 30.4* 37.5  MCV 87.1 88.1 87.6  PLT 199 158 204   Basic Metabolic Panel: Recent Labs  Lab 05/12/21 1701 05/13/21 0435 05/14/21 1042  NA 132* 133* 139  K 4.0 3.5 3.7  CL 99 105 106  CO2 25 24 25   GLUCOSE 144* 127* 104*  BUN 18 16 14   CREATININE 0.65 0.59 0.65  CALCIUM 9.2 8.1* 8.9  MG  --   --  1.9  PHOS  --   --  2.6   GFR: Estimated Creatinine Clearance: 45.5 mL/min (by C-G formula based on SCr of 0.65 mg/dL). Liver Function Tests: Recent Labs  Lab 05/14/21 1042  AST 16  ALT 12  ALKPHOS 65  BILITOT 0.7  PROT 6.5  ALBUMIN 3.5    No results for input(s): LIPASE, AMYLASE in the last 168 hours. No results for input(s): AMMONIA in the last 168 hours. Coagulation Profile: No results for input(s): INR, PROTIME in the last 168 hours. Cardiac Enzymes: No results for input(s): CKTOTAL, CKMB, CKMBINDEX, TROPONINI in the last 168 hours. BNP (last 3 results) No results for input(s): PROBNP in the last 8760 hours. HbA1C: No results for input(s): HGBA1C in the last 72 hours. CBG: No results for input(s): GLUCAP in the last 168 hours. Lipid Profile: No results for input(s): CHOL, HDL, LDLCALC, TRIG, CHOLHDL, LDLDIRECT in the last 72 hours. Thyroid Function Tests: No results for input(s): TSH, T4TOTAL, FREET4, T3FREE, THYROIDAB in the last 72 hours. Anemia Panel: No results for input(s): VITAMINB12, FOLATE, FERRITIN, TIBC, IRON, RETICCTPCT in the last 72 hours. Urine analysis:    Component Value Date/Time   COLORURINE YELLOW 02/07/2018 0931   APPEARANCEUR CLEAR 02/07/2018 0931   APPEARANCEUR Clear 01/28/2014 2113   LABSPEC 1.015 02/07/2018 0931   LABSPEC 1.011 01/28/2014 2113   PHURINE 7.5 02/07/2018 0931   GLUCOSEU NEGATIVE 02/07/2018 0931   GLUCOSEU Negative 01/28/2014 2113   HGBUR NEGATIVE 02/07/2018 0931   BILIRUBINUR NEGATIVE 02/07/2018 0931   BILIRUBINUR Negative 01/28/2014 2113   KETONESUR NEGATIVE 02/07/2018 0931   PROTEINUR NEGATIVE 02/07/2018 0931   NITRITE NEGATIVE 02/07/2018 0931   LEUKOCYTESUR TRACE (A) 02/07/2018 0931   LEUKOCYTESUR 3+ 01/28/2014 2113   Sepsis Labs: @LABRCNTIP (procalcitonin:4,lacticidven:4)  ) Recent Results (from the past 240 hour(s))  Blood culture (routine x 2)     Status: None (Preliminary result)   Collection Time: 05/12/21  7:10 PM   Specimen: BLOOD  Result Value Ref Range Status   Specimen Description BLOOD RIGHT ANTECUBITAL  Final   Special Requests   Final    BOTTLES DRAWN AEROBIC AND ANAEROBIC Blood Culture adequate volume   Culture   Final    NO GROWTH 2  DAYS Performed at [facility], [address] [place], [facility] [address]    Report Status PENDING  Incomplete  Blood culture (routine x 2)     Status: None (Preliminary result)   Collection Time: 05/12/21  7:10 PM   Specimen: BLOOD  Result Value Ref Range Status   Specimen Description BLOOD FOREARM  Final   Special Requests   Final    BOTTLES DRAWN AEROBIC AND ANAEROBIC Blood Culture adequate volume   Culture   Final    NO GROWTH 2 DAYS Performed at [facility], [address] [place], [facility] [address]    Report Status PENDING  Incomplete  Resp Panel by RT-PCR (Flu A&B, Covid) Nasopharyngeal Swab  Status: None   Collection Time: 05/12/21  7:10 PM   Specimen: Nasopharyngeal Swab; Nasopharyngeal(NP) swabs in vial transport medium  Result Value Ref Range Status   SARS Coronavirus 2 by RT PCR NEGATIVE NEGATIVE Final    Comment: (NOTE) SARS-CoV-2 target nucleic acids are NOT DETECTED.  The SARS-CoV-2 RNA is generally detectable in upper respiratory specimens during the acute phase of infection. The lowest concentration of SARS-CoV-2 viral copies this assay can detect is 138 copies/mL. A negative result does not preclude SARS-Cov-2 infection and should not be used as the sole basis for treatment or other patient management decisions. A negative result may occur with  improper specimen collection/handling, submission of specimen other than nasopharyngeal swab, presence of viral mutation(s) within the areas targeted by this assay, and inadequate number of viral copies(<138 copies/mL). A negative result must be combined with clinical observations, patient history, and epidemiological information. The expected result is Negative.  Fact Sheet for Patients:  BloggerCourse.comhttps://www.fda.gov/media/152166/download  Fact Sheet for Healthcare Providers:  SeriousBroker.ithttps://www.fda.gov/media/152162/download  This test is no t yet approved or cleared by the [place] FDA and  has  been authorized for detection and/or diagnosis of SARS-CoV-2 by FDA under an Emergency Use Authorization (EUA). This EUA will remain  in effect (meaning this test can be used) for the duration of the COVID-19 declaration under Section 564(b)(1) of the Act, 21 U.S.C.section 360bbb-3(b)(1), unless the authorization is terminated  or revoked sooner.       Influenza A by PCR NEGATIVE NEGATIVE Final   Influenza B by PCR NEGATIVE NEGATIVE Final    Comment: (NOTE) The Xpert Xpress SARS-CoV-2/FLU/RSV plus assay is intended as an aid in the diagnosis of influenza from Nasopharyngeal swab specimens and should not be used as a sole basis for treatment. Nasal washings and aspirates are unacceptable for Xpert Xpress SARS-CoV-2/FLU/RSV testing.  Fact Sheet for Patients: BloggerCourse.comhttps://www.fda.gov/media/152166/download  Fact Sheet for Healthcare Providers: SeriousBroker.ithttps://www.fda.gov/media/152162/download  This test is not yet approved or cleared by the [place] FDA and has been authorized for detection and/or diagnosis of SARS-CoV-2 by FDA under an Emergency Use Authorization (EUA). This EUA will remain in effect (meaning this test can be used) for the duration of the COVID-19 declaration under Section 564(b)(1) of the Act, 21 U.S.C. section 360bbb-3(b)(1), unless the authorization is terminated or revoked.  Performed at [facility], [address] [place], [place] [address]   Expectorated Sputum Assessment w Gram Stain, Rflx to Resp Cult     Status: None   Collection Time: 05/14/21  1:32 PM   Specimen: Expectorated Sputum  Result Value Ref Range Status   Specimen Description EXPECTORATED SPUTUM  Final   Special Requests NONE  Final   Sputum evaluation   Final    THIS SPECIMEN IS ACCEPTABLE FOR SPUTUM CULTURE Performed at [facility], [address] [place], [place] [address]    Report Status 05/14/2021 FINAL  Final      Studies: No results found.  Scheduled  Meds:  amLODipine  10 mg Oral Daily   aspirin EC  81 mg Oral Daily   atorvastatin  40 mg Oral QHS   azithromycin  500 mg Oral Daily   benzonatate  100 mg Oral Q8H   enoxaparin (LOVENOX) injection  40 mg Subcutaneous Q24H   fluticasone  1 spray Each Nare Daily   gabapentin  800 mg Oral BID   guaiFENesin-dextromethorphan  10 mL Oral BID   polyethylene glycol  17 g Oral Daily  Continuous Infusions:  cefTRIAXone (ROCEPHIN)  IV Stopped (05/13/21 2158)     LOS: 0 days     Darlin Drop, MD [facility] Pager [phone]  If 7PM-7AM, please contact night-coverage [email or web address] Password [facility] 05/14/2021, 4:35 PM

## 2021-05-14 NOTE — Progress Notes (Signed)
PHARMACIST - PHYSICIAN COMMUNICATION DR:   Margo Aye CONCERNING: Antibiotic IV to Oral Route Change Policy  RECOMMENDATION: This patient is receiving azithromycin by the intravenous route.  Based on criteria approved by the Pharmacy and Therapeutics Committee, the antibiotic(s) is/are being converted to the equivalent oral dose form(s).   DESCRIPTION: These criteria include: Patient being treated for a respiratory tract infection, urinary tract infection, cellulitis or clostridium difficile associated diarrhea if on metronidazole The patient is not neutropenic and does not exhibit a GI malabsorption state The patient is eating (either orally or via tube) and/or has been taking other orally administered medications for a least 24 hours The patient is improving clinically and has a Tmax < 100.5  If you have questions about this conversion, please contact the Pharmacy Department  []   [phone] )  ( [phone] [[phone]]   [phone] )  [facility] []   [phone] )  [facility] []   [phone] )  [facility] []   [phone] )  [facility]

## 2021-05-15 DIAGNOSIS — J189 Pneumonia, unspecified organism: Secondary | ICD-10-CM | POA: Diagnosis not present

## 2021-05-15 LAB — PROCALCITONIN: Procalcitonin: 0.22 ng/mL

## 2021-05-15 MED ORDER — CEFDINIR 300 MG PO CAPS
300.0000 mg | ORAL_CAPSULE | Freq: Two times a day (BID) | ORAL | Status: DC
  Administered 2021-05-15: 07:00:00 300 mg via ORAL
  Filled 2021-05-15 (×2): qty 1

## 2021-05-15 MED ORDER — CEFDINIR 300 MG PO CAPS: 300.0000 mg | ORAL_CAPSULE | Freq: Two times a day (BID) | ORAL | 0 refills | Status: AC

## 2021-05-15 NOTE — Progress Notes (Signed)
Pt made aware of discharge. She is awaiting her sister's arrival to "help her get dressed."

## 2021-05-15 NOTE — Discharge Summary (Signed)
Discharge Summary  Emily Rivas:[id number] DOB: 04/17/1929  PCP: Leim Fabry, MD  Admit date: 05/12/2021 Discharge date: 05/15/2021  Time spent: 35 minutes  Recommendations for Outpatient Follow-up:  Follow-up with your primary care provider Take your medications as instructed Outpatient physical therapy.  Discharge Diagnoses:  Active Hospital Problems   Diagnosis Date Noted   PNA (pneumonia) 05/12/2021   Hyperlipidemia 04/13/2021   PAD (peripheral artery disease) (HCC) 04/13/2021   Insomnia 03/27/2014   Neuropathy 10/15/2013   Esophageal reflux 01/05/2011   Depressive disorder 07/11/2000   Essential hypertension 07/11/2000   Osteoarthrosis 07/11/2000    Resolved Hospital Problems  No resolved problems to display.    Discharge Condition: Stable  Diet recommendation: Resume previous diet  Vitals:   05/15/21 0329 05/15/21 0741  BP: 131/64 (!) 152/76  Pulse: 70 76  Resp: 16 17  Temp: 98.4 F (36.9 C) 98 F (36.7 C)  SpO2: 93% 94%    History of present illness:   Emily Rivas is a 87 y.o. female with medical history significant for Hypertension, hyperlipidemia, peripheral neuropathy, presents to the emergency department for chief concerns of cough x 1.5 weeks.  Work-up revealed sepsis secondary to left lower lobe pneumonia for which she is on Rocephin and azithromycin.  COVID-19 screening test negative on 05/12/2021.   Vaccinations: 2 doses of covid 19, pfizer  05/15/2021: Seen and examined at her bedside.  No acute events overnight.  She has no new complaints.  She is eager to go home.  Hospital Course:  Principal Problem:   PNA (pneumonia) Active Problems:   Osteoarthrosis   Depressive disorder   Esophageal reflux   Essential hypertension   Hyperlipidemia   Insomnia   Neuropathy   PAD (peripheral artery disease) (HCC)  Sepsis, resolving, secondary to left lower lobe pneumonia, POA: She presented with leukocytosis 24K, fever with T-max 101.1,  tachypnea respiration rate 24 with evidence of left lower lobe pneumonia on chest x-ray. She completed 3 days of IV Rocephin and IV azithromycin  Switched to p.o. cefdinir 300 mg twice daily x5 days on 05/15/2021. Afebrile with no leukocytosis on day of discharge.  Vital signs stable. Follow-up with your primary care provider.   HTN:  BP stable. continue amlodipine   HLD: Continue statin   Peripheral neuropathy: continue gabapentin   Resolved hyponatremia: Serum sodium 139.      Code Status: full      Consultants:  None   Procedures: None   Antimicrobials: azithromycin, ceftriaxone Cefdinir started on 05/15/2021 300 mg twice daily x5 days.      Discharge Exam: BP (!) 152/76   Pulse 76   Temp 98 F (36.7 C)   Resp 17   Ht 5\' 4"  (1.626 m)   Wt 72 kg   SpO2 94%   BMI 27.25 kg/m  General: 87 y.o. year-old female well developed well nourished in no acute distress.  Alert and oriented x3. Cardiovascular: Regular rate and rhythm with no rubs or gallops.  No thyromegaly or JVD noted.   Respiratory: Clear to auscultation with no wheezes or rales. Good inspiratory effort. Abdomen: Soft nontender nondistended with normal bowel sounds x4 quadrants. Musculoskeletal: No lower extremity edema. 2/4 pulses in all 4 extremities. Skin: No ulcerative lesions noted or rashes, Psychiatry: Mood is appropriate for condition and setting  Discharge Instructions You were cared for by a hospitalist during your hospital stay. If you have any questions about your discharge medications or the care you received while you  were in the hospital after you are discharged, you can call the unit and asked to speak with the hospitalist on call if the hospitalist that took care of you is not available. Once you are discharged, your primary care physician will handle any further medical issues. Please note that NO REFILLS for any discharge medications will be authorized once you are discharged, as it is  imperative that you return to your primary care physician (or establish a relationship with a primary care physician if you do not have one) for your aftercare needs so that they can reassess your need for medications and monitor your lab values.   Allergies as of 05/15/2021       Reactions   Ace Inhibitors Cough   Amoxicillin Swelling   Lisinopril Swelling   Ciprofloxacin Rash        Medication List     STOP taking these medications    benzonatate 100 MG capsule Commonly known as: TESSALON   fexofenadine 180 MG tablet Commonly known as: ALLEGRA   guaiFENesin-dextromethorphan 100-10 MG/5ML syrup Commonly known as: ROBITUSSIN DM       TAKE these medications    alendronate 70 MG tablet Commonly known as: FOSAMAX Take 70 mg by mouth once a week. Wednesday   amLODipine 10 MG tablet Commonly known as: NORVASC Take 10 mg by mouth daily.   aspirin EC 81 MG tablet Take 81 mg by mouth daily.   atorvastatin 40 MG tablet Commonly known as: LIPITOR Take 40 mg by mouth daily.   calcium-vitamin D 500-200 MG-UNIT tablet Commonly known as: OSCAL WITH D Take 1 tablet by mouth.   cefdinir 300 MG capsule Commonly known as: OMNICEF Take 1 capsule (300 mg total) by mouth every 12 (twelve) hours for 5 days.   diclofenac sodium 1 % Gel Commonly known as: VOLTAREN Apply topically 4 (four) times daily.   fluticasone 50 MCG/ACT nasal spray Commonly known as: FLONASE Place 1 spray into both nostrils daily.   gabapentin 800 MG tablet Commonly known as: NEURONTIN Take 800 mg by mouth 2 (two) times daily.   multivitamin tablet Take 1 tablet by mouth daily.   polyethylene glycol 17 g packet Commonly known as: MIRALAX / GLYCOLAX Take 17 g by mouth daily.   traZODone 50 MG tablet Commonly known as: DESYREL Take 50 mg by mouth at bedtime.       Allergies  Allergen Reactions   Ace Inhibitors Cough   Amoxicillin Swelling   Lisinopril Swelling   Ciprofloxacin Rash     Follow-up Information     Leim FabryAldridge, Barbara, MD. Schedule an appointment as soon as possible for a visit today.   Specialty: Family Medicine Why: Please call for a posthospital follow-up appointment. Contact information: [address] [place] [id number] [phone]                  The results of significant diagnostics from this hospitalization (including imaging, microbiology, ancillary and laboratory) are listed below for reference.    Significant Diagnostic Studies: DG Chest 2 View  Result Date: 05/12/2021 CLINICAL DATA:  Cough and fever EXAM: CHEST - 2 VIEW COMPARISON:  1/30/9 FINDINGS: Normal heart size. No pleural effusion or interstitial edema. Retrocardiac opacity identified within the left lower lobe compatible with pneumonia. Right lung appears clear. Visualized osseous structures appear intact. IMPRESSION: Left lower lobe pneumonia. Electronically Signed   By: Signa Kellaylor  Stroud M.D.   On: 05/12/2021 17:33    Microbiology: Recent Results (from the  past 240 hour(s))  Blood culture (routine x 2)     Status: None (Preliminary result)   Collection Time: 05/12/21  7:10 PM   Specimen: BLOOD  Result Value Ref Range Status   Specimen Description BLOOD RIGHT ANTECUBITAL  Final   Special Requests   Final    BOTTLES DRAWN AEROBIC AND ANAEROBIC Blood Culture adequate volume   Culture   Final    NO GROWTH 2 DAYS Performed at [facility], [address], [place], [place] [address]    Report Status PENDING  Incomplete  Blood culture (routine x 2)     Status: None (Preliminary result)   Collection Time: 05/12/21  7:10 PM   Specimen: BLOOD  Result Value Ref Range Status   Specimen Description BLOOD FOREARM  Final   Special Requests   Final    BOTTLES DRAWN AEROBIC AND ANAEROBIC Blood Culture adequate volume   Culture   Final    NO GROWTH 2 DAYS Performed at [facility], [address] [place], [place] [address]    Report Status PENDING   Incomplete  Resp Panel by RT-PCR (Flu A&B, Covid) Nasopharyngeal Swab     Status: None   Collection Time: 05/12/21  7:10 PM   Specimen: Nasopharyngeal Swab; Nasopharyngeal(NP) swabs in vial transport medium  Result Value Ref Range Status   SARS Coronavirus 2 by RT PCR NEGATIVE NEGATIVE Final    Comment: (NOTE) SARS-CoV-2 target nucleic acids are NOT DETECTED.  The SARS-CoV-2 RNA is generally detectable in upper respiratory specimens during the acute phase of infection. The lowest concentration of SARS-CoV-2 viral copies this assay can detect is 138 copies/mL. A negative result does not preclude SARS-Cov-2 infection and should not be used as the sole basis for treatment or other patient management decisions. A negative result may occur with  improper specimen collection/handling, submission of specimen other than nasopharyngeal swab, presence of viral mutation(s) within the areas targeted by this assay, and inadequate number of viral copies(<138 copies/mL). A negative result must be combined with clinical observations, patient history, and epidemiological information. The expected result is Negative.  Fact Sheet for Patients:  BloggerCourse.com  Fact Sheet for Healthcare Providers:  SeriousBroker.it  This test is no t yet approved or cleared by the [place] FDA and  has been authorized for detection and/or diagnosis of SARS-CoV-2 by FDA under an Emergency Use Authorization (EUA). This EUA will remain  in effect (meaning this test can be used) for the duration of the COVID-19 declaration under Section 564(b)(1) of the Act, 21 U.S.C.section 360bbb-3(b)(1), unless the authorization is terminated  or revoked sooner.       Influenza A by PCR NEGATIVE NEGATIVE Final   Influenza B by PCR NEGATIVE NEGATIVE Final    Comment: (NOTE) The Xpert Xpress SARS-CoV-2/FLU/RSV plus assay is intended as an aid in the diagnosis of influenza  from Nasopharyngeal swab specimens and should not be used as a sole basis for treatment. Nasal washings and aspirates are unacceptable for Xpert Xpress SARS-CoV-2/FLU/RSV testing.  Fact Sheet for Patients: BloggerCourse.com  Fact Sheet for Healthcare Providers: SeriousBroker.it  This test is not yet approved or cleared by the [place] FDA and has been authorized for detection and/or diagnosis of SARS-CoV-2 by FDA under an Emergency Use Authorization (EUA). This EUA will remain in effect (meaning this test can be used) for the duration of the COVID-19 declaration under Section 564(b)(1) of the Act, 21 U.S.C. section 360bbb-3(b)(1), unless the authorization is terminated or revoked.  Performed at [facility], [address] [place], [place] [address]   Expectorated Sputum Assessment w Gram Stain, Rflx to Resp Cult     Status: None   Collection Time: 05/14/21  1:32 PM   Specimen: Expectorated Sputum  Result Value Ref Range Status   Specimen Description EXPECTORATED SPUTUM  Final   Special Requests NONE  Final   Sputum evaluation   Final    THIS SPECIMEN IS ACCEPTABLE FOR SPUTUM CULTURE Performed at [facility], [address], [place], [place] [address]    Report Status 05/14/2021 FINAL  Final  Culture, Respiratory w Gram Stain     Status: None (Preliminary result)   Collection Time: 05/14/21  1:32 PM  Result Value Ref Range Status   Specimen Description   Final    EXPECTORATED SPUTUM Performed at [facility], [address] [place], [place] [address]    Special Requests   Final    NONE Reflexed from [id number] Performed at [facility], [address] [place], [place] [address]    Gram Stain   Final    FEW WBC PRESENT, PREDOMINANTLY PMN FEW GRAM POSITIVE COCCI IN CLUSTERS    Culture   Final    TOO YOUNG TO READ Performed at [facility], [address],  [place], [place] [address]    Report Status PENDING  Incomplete     Labs: Basic Metabolic Panel: Recent Labs  Lab 05/12/21 1701 05/13/21 0435 05/14/21 1042  NA 132* 133* 139  K 4.0 3.5 3.7  CL 99 105 106  CO2 25 24 25   GLUCOSE 144* 127* 104*  BUN 18 16 14   CREATININE 0.65 0.59 0.65  CALCIUM 9.2 8.1* 8.9  MG  --   --  1.9  PHOS  --   --  2.6   Liver Function Tests: Recent Labs  Lab 05/14/21 1042  AST 16  ALT 12  ALKPHOS 65  BILITOT 0.7  PROT 6.5  ALBUMIN 3.5   No results for input(s): LIPASE, AMYLASE in the last 168 hours. No results for input(s): AMMONIA in the last 168 hours. CBC: Recent Labs  Lab 05/12/21 1701 05/13/21 0435 05/14/21 1042  WBC 24.2* 20.6* 9.8  NEUTROABS 20.9*  --   --   HGB 13.0 10.5* 12.9  HCT 38.6 30.4* 37.5  MCV 87.1 88.1 87.6  PLT 199 158 204   Cardiac Enzymes: No results for input(s): CKTOTAL, CKMB, CKMBINDEX, TROPONINI in the last 168 hours. BNP: BNP (last 3 results) No results for input(s): BNP in the last 8760 hours.  ProBNP (last 3 results) No results for input(s): PROBNP in the last 8760 hours.  CBG: No results for input(s): GLUCAP in the last 168 hours.     Signed:  07/13/21, MD [facility] 05/15/2021, 5:20 PM

## 2021-05-17 LAB — CULTURE, RESPIRATORY W GRAM STAIN: Culture: NORMAL

## 2021-05-18 LAB — CULTURE, BLOOD (ROUTINE X 2)
Culture: NO GROWTH
Culture: NO GROWTH
Special Requests: ADEQUATE
Special Requests: ADEQUATE

## 2022-01-12 ENCOUNTER — Other Ambulatory Visit: Payer: Self-pay | Admitting: Family Medicine

## 2022-01-12 ENCOUNTER — Ambulatory Visit
Admission: RE | Admit: 2022-01-12 | Discharge: 2022-01-12 | Disposition: A | Discharge status: Home / Self Care | Payer: Medicare PPO | Source: Ambulatory Visit | Attending: Family Medicine | Admitting: Family Medicine

## 2022-01-12 ENCOUNTER — Ambulatory Visit
Admission: RE | Admit: 2022-01-12 | Discharge: 2022-01-12 | Disposition: A | Discharge status: Home / Self Care | Payer: Medicare PPO | Attending: Family Medicine | Admitting: Family Medicine

## 2022-01-12 DIAGNOSIS — R0781 Pleurodynia: Secondary | ICD-10-CM | POA: Diagnosis present

## 2022-02-05 ENCOUNTER — Ambulatory Visit
Admission: RE | Admit: 2022-02-05 | Discharge: 2022-02-05 | Disposition: A | Discharge status: Home / Self Care | Payer: Medicare PPO | Attending: Family Medicine | Admitting: Family Medicine

## 2022-02-05 ENCOUNTER — Other Ambulatory Visit: Payer: Self-pay | Admitting: Family Medicine

## 2022-02-05 ENCOUNTER — Ambulatory Visit
Admission: RE | Admit: 2022-02-05 | Discharge: 2022-02-05 | Disposition: A | Discharge status: Home / Self Care | Payer: Medicare PPO | Source: Ambulatory Visit | Attending: Family Medicine | Admitting: Family Medicine

## 2022-02-05 ENCOUNTER — Other Ambulatory Visit: Payer: Self-pay

## 2022-02-05 DIAGNOSIS — R059 Cough, unspecified: Secondary | ICD-10-CM | POA: Insufficient documentation

## 2022-02-23 ENCOUNTER — Other Ambulatory Visit: Payer: Self-pay

## 2022-02-23 ENCOUNTER — Ambulatory Visit
Admission: RE | Admit: 2022-02-23 | Discharge: 2022-02-23 | Disposition: A | Discharge status: Home / Self Care | Payer: Medicare PPO | Source: Ambulatory Visit | Attending: Family Medicine | Admitting: Family Medicine

## 2022-02-23 DIAGNOSIS — M810 Age-related osteoporosis without current pathological fracture: Secondary | ICD-10-CM | POA: Insufficient documentation

## 2023-01-11 ENCOUNTER — Other Ambulatory Visit: Payer: Self-pay | Admitting: Family Medicine

## 2023-01-11 ENCOUNTER — Ambulatory Visit
Admission: RE | Admit: 2023-01-11 | Discharge: 2023-01-11 | Disposition: A | Discharge status: Home / Self Care | Payer: Medicare PPO | Source: Ambulatory Visit | Attending: Family Medicine | Admitting: Family Medicine

## 2023-01-11 ENCOUNTER — Ambulatory Visit
Admission: RE | Admit: 2023-01-11 | Discharge: 2023-01-11 | Disposition: A | Discharge status: Home / Self Care | Payer: Medicare PPO | Source: Other Acute Inpatient Hospital | Attending: Family Medicine | Admitting: Family Medicine

## 2023-01-11 ENCOUNTER — Ambulatory Visit: Admit: 2023-01-11 | Payer: Medicare PPO

## 2023-01-11 ENCOUNTER — Other Ambulatory Visit: Admission: RE | Admit: 2023-01-11 | Payer: Medicare PPO | Source: Home / Self Care

## 2023-01-11 DIAGNOSIS — R0781 Pleurodynia: Secondary | ICD-10-CM | POA: Insufficient documentation

## 2023-05-12 ENCOUNTER — Other Ambulatory Visit (INDEPENDENT_AMBULATORY_CARE_PROVIDER_SITE_OTHER): Payer: Self-pay | Admitting: Vascular Surgery

## 2023-05-12 DIAGNOSIS — R9389 Abnormal findings on diagnostic imaging of other specified body structures: Secondary | ICD-10-CM

## 2023-05-17 ENCOUNTER — Ambulatory Visit (INDEPENDENT_AMBULATORY_CARE_PROVIDER_SITE_OTHER): Payer: Medicare PPO

## 2023-05-17 ENCOUNTER — Ambulatory Visit (INDEPENDENT_AMBULATORY_CARE_PROVIDER_SITE_OTHER): Payer: Medicare PPO | Admitting: Nurse Practitioner

## 2023-05-17 ENCOUNTER — Other Ambulatory Visit (INDEPENDENT_AMBULATORY_CARE_PROVIDER_SITE_OTHER): Payer: Self-pay | Admitting: Nurse Practitioner

## 2023-05-17 ENCOUNTER — Encounter (INDEPENDENT_AMBULATORY_CARE_PROVIDER_SITE_OTHER): Payer: Self-pay | Admitting: Nurse Practitioner

## 2023-05-17 VITALS — BP 116/75 | HR 81 | Resp 19 | Ht 60.0 in | Wt 160.4 lb

## 2023-05-17 DIAGNOSIS — I739 Peripheral vascular disease, unspecified: Secondary | ICD-10-CM | POA: Diagnosis not present

## 2023-05-17 DIAGNOSIS — I10 Essential (primary) hypertension: Secondary | ICD-10-CM | POA: Diagnosis not present

## 2023-05-17 DIAGNOSIS — R9389 Abnormal findings on diagnostic imaging of other specified body structures: Secondary | ICD-10-CM

## 2023-05-17 DIAGNOSIS — G629 Polyneuropathy, unspecified: Secondary | ICD-10-CM | POA: Diagnosis not present

## 2023-05-17 MED ORDER — ROPINIROLE HCL 0.25 MG PO TABS: ORAL_TABLET | ORAL | 1 refills | Status: AC

## 2023-05-17 NOTE — Progress Notes (Signed)
Subjective:    Patient ID: Emily Rivas, female    DOB: 1929/11/13, 89 y.o.   MRN: [id number] Chief Complaint  Patient presents with  . New Patient (Initial Visit)    ABI + consult. abnormal ABI screening from insurance nurse. reporting left ABI 0.61 and right 0.82. referred by Rolin Barry.    Emily Rivas is a 89 year old female that returns to the office for followup and review of the noninvasive studies.  She  There have been no interval changes in lower extremity symptoms. No interval shortening of the patient's claudication distance or development of rest pain symptoms. No new ulcers or wounds have occurred since the last visit.  There have been no significant changes to the patient's overall health care.  The patient denies amaurosis fugax or recent TIA symptoms. There are no documented recent neurological changes noted. There is no history of DVT, PE or superficial thrombophlebitis. The patient denies recent episodes of angina or shortness of breath.   ABI Rt=1.09 and Lt=1.00  (previous ABI's Rt=1.14 and Lt=1.15) Duplex ultrasound of the bilateral tibial vessels have biphasic wave waveforms bilaterally   Review of Systems  Cardiovascular:  Negative for leg swelling.       Denies claudication  Musculoskeletal:  Positive for myalgias.  All other systems reviewed and are negative.      Objective:   Physical Exam Vitals reviewed.  HENT:     Head: Normocephalic.  Cardiovascular:     Rate and Rhythm: Normal rate.     Pulses:          Dorsalis pedis pulses are detected w/ Doppler on the right side and detected w/ Doppler on the left side.       Posterior tibial pulses are detected w/ Doppler on the right side and detected w/ Doppler on the left side.  Pulmonary:     Effort: Pulmonary effort is normal.  Skin:    General: Skin is warm and dry.  Neurological:     Mental Status: She is alert and oriented to person, place, and time.     Gait: Gait abnormal.  Psychiatric:         Mood and Affect: Mood normal.        Behavior: Behavior normal.        Thought Content: Thought content normal.        Judgment: Judgment normal.    BP 116/75 (BP Location: Left Arm)   Pulse 81   Resp 19   Ht 5' (1.524 m)   Wt 160 lb 6.4 oz (72.8 kg)   BMI 31.33 kg/m   Past Medical History:  Diagnosis Date  . Arthritis   . Diverticulitis   . Glaucoma   . Hypercholesteremia   . Hyperlipidemia   . Hypertension   . Insomnia   . Osteoporosis   . PVC (premature ventricular contraction)     Social History   Socioeconomic History  . Marital status: Married    Spouse name: Not on file  . Number of children: Not on file  . Years of education: Not on file  . Highest education level: Not on file  Occupational History  . Not on file  Tobacco Use  . Smoking status: Never  . Smokeless tobacco: Never  Vaping Use  . Vaping Use: Never used  Substance and Sexual Activity  . Alcohol use: No  . Drug use: No  . Sexual activity: Not on file  Other Topics Concern  . Not on  file  Social History Narrative  . Not on file   Social Determinants of Health   Financial Resource Strain: Not on file  Food Insecurity: Not on file  Transportation Needs: Not on file  Physical Activity: Not on file  Stress: Not on file  Social Connections: Not on file  Intimate Partner Violence: Not on file    Past Surgical History:  Procedure Laterality Date  . ABDOMINAL HYSTERECTOMY    . APPENDECTOMY    . EYE SURGERY    . HERNIA REPAIR    . OOPHORECTOMY      Family History  Problem Relation Age of Onset  . Liver cancer Mother   . Cirrhosis Mother   . Heart attack Father   . Breast cancer Neg Hx     Allergies  Allergen Reactions  . Ace Inhibitors Cough  . Amoxicillin Swelling  . Lisinopril Swelling  . Ciprofloxacin Rash       Latest Ref Rng & Units 05/14/2021   10:42 AM 05/13/2021    4:35 AM 05/12/2021    5:01 PM  CBC  WBC 4.0 - 10.5 K/uL 9.8  20.6  24.2   Hemoglobin 12.0 -  15.0 g/dL 40.9  81.1  91.4   Hematocrit 36.0 - 46.0 % 37.5  30.4  38.6   Platelets 150 - 400 K/uL 204  158  199       CMP     Component Value Date/Time   NA 139 05/14/2021 1042   NA 129 (L) 01/28/2014 2113   K 3.7 05/14/2021 1042   K 4.2 01/28/2014 2113   CL 106 05/14/2021 1042   CL 97 (L) 01/28/2014 2113   CO2 25 05/14/2021 1042   CO2 26 01/28/2014 2113   GLUCOSE 104 (H) 05/14/2021 1042   GLUCOSE 114 (H) 01/28/2014 2113   BUN 14 05/14/2021 1042   BUN 16 01/28/2014 2113   CREATININE 0.65 05/14/2021 1042   CREATININE 0.59 (L) 01/28/2014 2113   CALCIUM 8.9 05/14/2021 1042   CALCIUM 9.4 01/28/2014 2113   PROT 6.5 05/14/2021 1042   PROT 7.1 10/31/2013 1523   ALBUMIN 3.5 05/14/2021 1042   ALBUMIN 4.1 10/31/2013 1523   AST 16 05/14/2021 1042   AST 21 10/31/2013 1523   ALT 12 05/14/2021 1042   ALT 28 10/31/2013 1523   ALKPHOS 65 05/14/2021 1042   ALKPHOS 81 10/31/2013 1523   BILITOT 0.7 05/14/2021 1042   BILITOT 0.3 10/31/2013 1523   GFRNONAA >60 05/14/2021 1042   GFRNONAA >60 01/28/2014 2113     No results found.     Assessment & Plan:   1. PAD (peripheral artery disease) (HCC) Today the patient's noninvasive studies indicate that she does have some evidence of very mild PAD the ABIs were not accurate that were noted with home health.  Her studies are relatively consistent with what we saw about 2 years ago.  The pain that she is having has been ongoing for the last 1 year, and that she does not have any evidence of claudication or other symptoms of vascular disease which would be consistent with rest pain.  Based on this we will have the patient return in 1 year to repeat noninvasive studies.  2. Neuropathy The patient's pain not related to the peripheral arterial disease pattern quite consistent with rest pain more so Upon discussion with the patient and evaluation some of her medications we will try the patient on Requip to see if this helps somewhat.  If she  does  find it helpful, she should follow-up with her PCP for continued follow-up.  3. Essential hypertension Continue antihypertensive medications as already ordered, these medications have been reviewed and there are no changes at this time.   Current Outpatient Medications on File Prior to Visit  Medication Sig Dispense Refill  . amLODipine (NORVASC) 10 MG tablet Take 10 mg by mouth daily.    Marland Kitchen aspirin EC 81 MG tablet Take 81 mg by mouth daily.    Marland Kitchen atorvastatin (LIPITOR) 40 MG tablet Take 40 mg by mouth daily.    . calcium-vitamin D (OSCAL WITH D) 500-200 MG-UNIT tablet Take 1 tablet by mouth.    . fluticasone (FLONASE) 50 MCG/ACT nasal spray Place 1 spray into both nostrils daily.    Marland Kitchen gabapentin (NEURONTIN) 800 MG tablet Take 800 mg by mouth 2 (two) times daily.    . Multiple Vitamin (MULTIVITAMIN) tablet Take 1 tablet by mouth daily.    Marland Kitchen nystatin (MYCOSTATIN) 100000 UNIT/ML suspension Take 5 mLs by mouth 4 (four) times daily.    . polyethylene glycol (MIRALAX / GLYCOLAX) packet Take 17 g by mouth daily.    . traZODone (DESYREL) 50 MG tablet Take 50 mg by mouth at bedtime.    Marland Kitchen alendronate (FOSAMAX) 70 MG tablet Take 70 mg by mouth once a week. Wednesday (Patient not taking: Reported on 05/17/2023)    . diclofenac sodium (VOLTAREN) 1 % GEL Apply topically 4 (four) times daily. (Patient not taking: Reported on 05/17/2023)     No current facility-administered medications on file prior to visit.    There are no Patient Instructions on file for this visit. No follow-ups on file.   Georgiana Spinner, NP

## 2023-05-17 NOTE — Telephone Encounter (Signed)
Duplicate

## 2023-05-19 LAB — VAS US ABI WITH/WO TBI
Left ABI: 1
Right ABI: 1.09

## 2023-07-19 ENCOUNTER — Other Ambulatory Visit (INDEPENDENT_AMBULATORY_CARE_PROVIDER_SITE_OTHER): Payer: Self-pay | Admitting: Nurse Practitioner

## 2023-07-20 NOTE — Telephone Encounter (Signed)
Called patient to ask patient if roPINIRole [facility] is working for her. If so, she should follow up with her PCP to continue to get her medication refilled.

## 2023-07-20 NOTE — Telephone Encounter (Signed)
Patient called back I gave patient  nurses advice patient understood and was ok with advice

## 2023-07-20 NOTE — Telephone Encounter (Signed)
Patient should follow up with Primary care for Refills

## 2023-08-05 IMAGING — CR DG CHEST 2V
2 series · 2 of 2 positions shown · non-contrast
Comparison: 01/12/2022

CLINICAL DATA: Cough for several days.

EXAM:
CHEST - 2 VIEW

[chest pa]
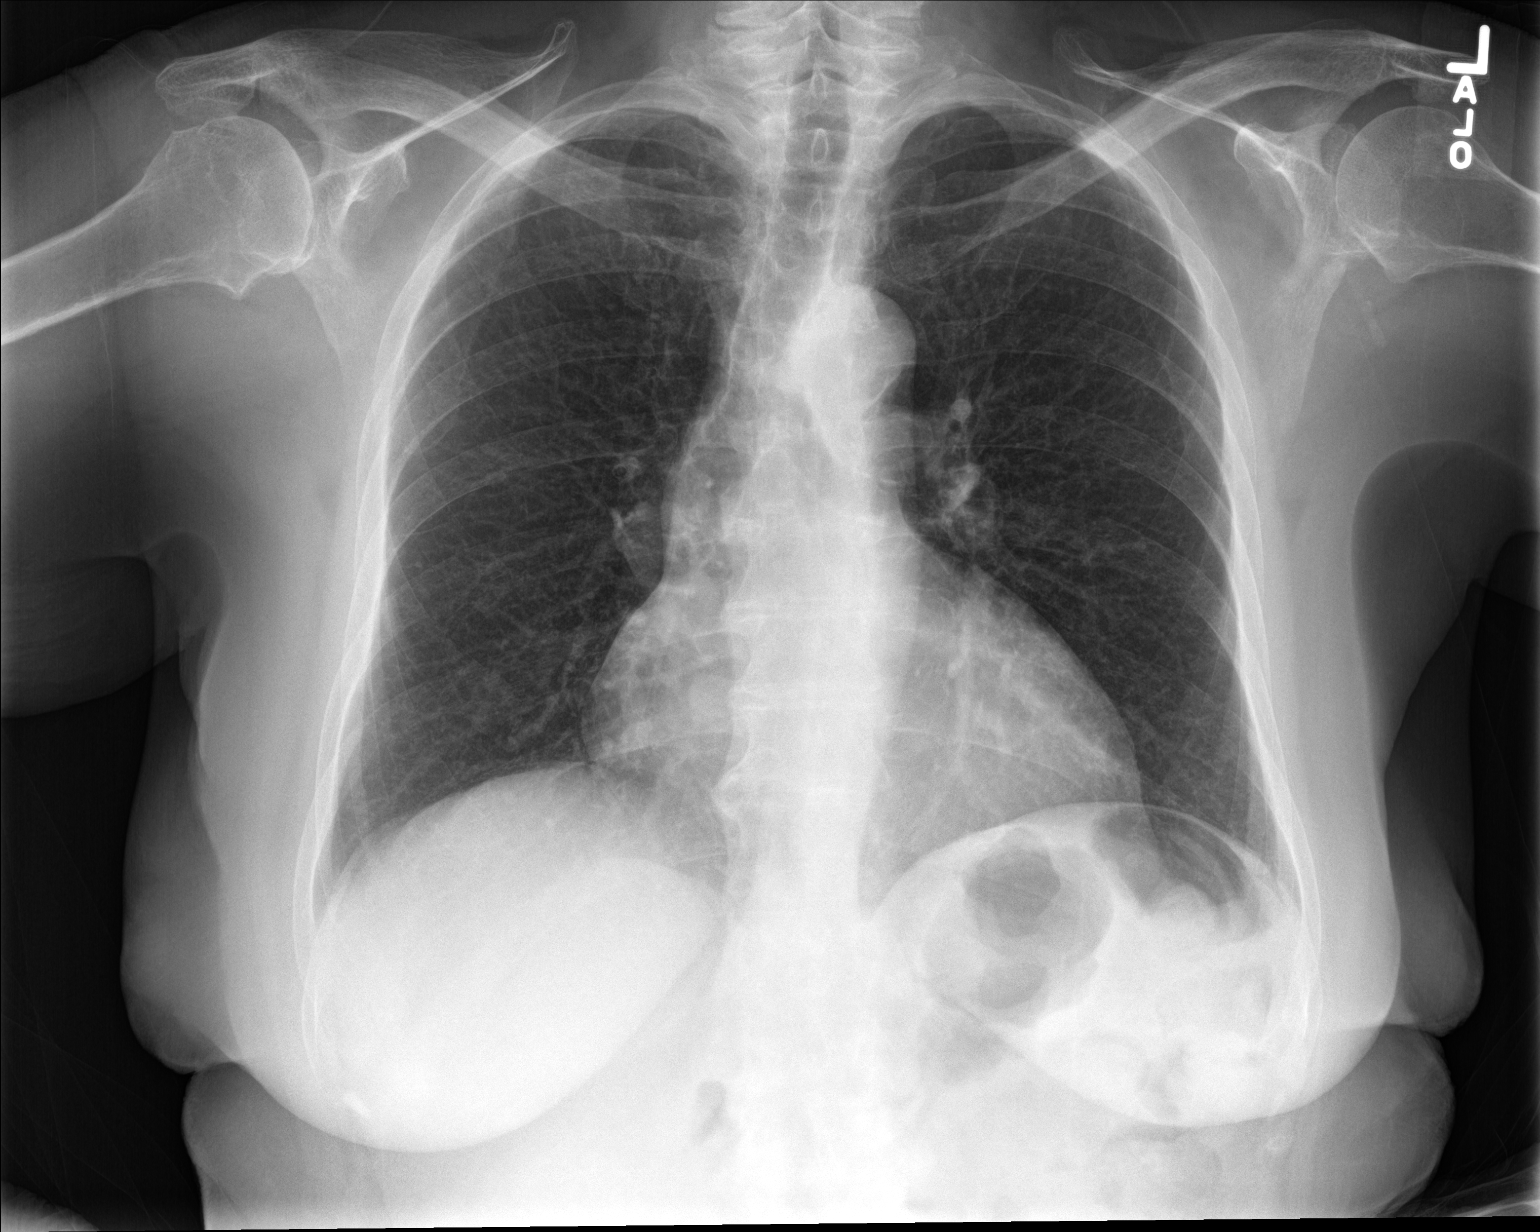

[chest lat]
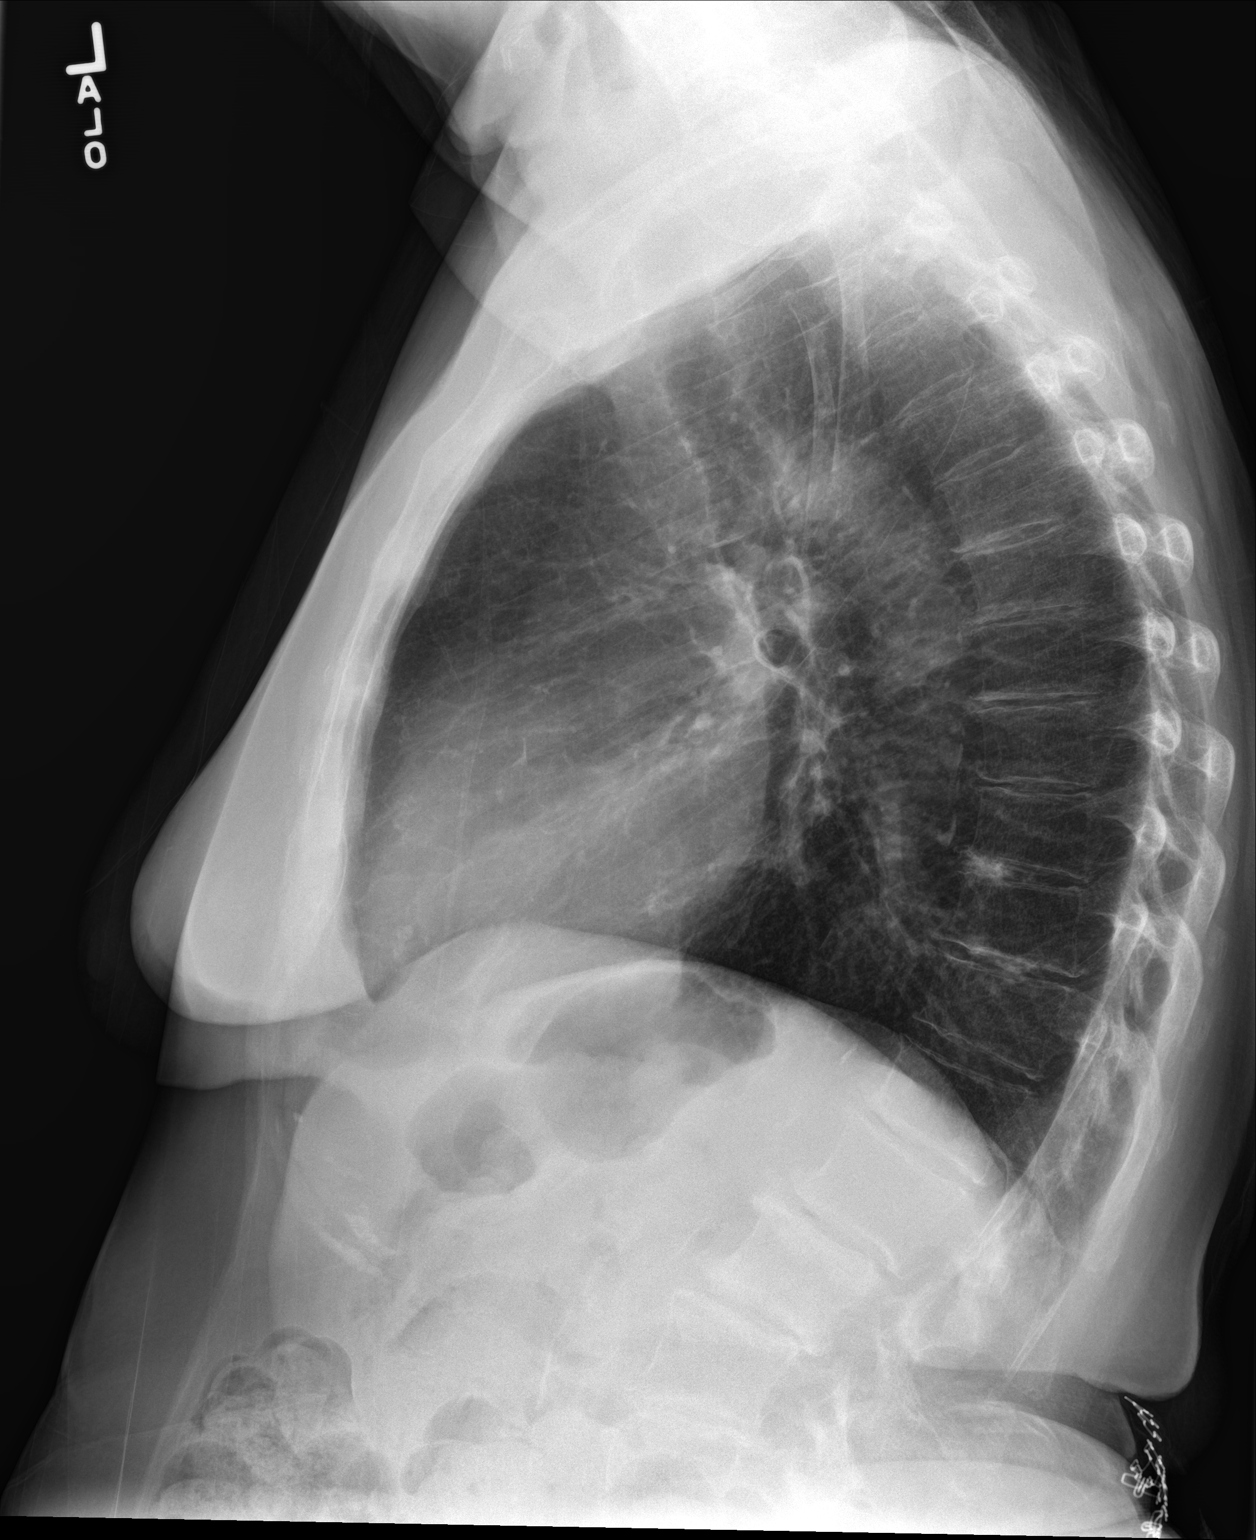

[2 of 2 positions shown; findings below may reference images not displayed]

FINDINGS: Cardiac shadow is at the upper limits of normal in size. Aortic
calcifications are noted. The lungs are clear. Mild degenerative
changes of the thoracic spine are seen.
IMPRESSION: No acute abnormality noted.

## 2024-05-15 ENCOUNTER — Other Ambulatory Visit (INDEPENDENT_AMBULATORY_CARE_PROVIDER_SITE_OTHER): Payer: Self-pay | Admitting: Nurse Practitioner

## 2024-05-15 DIAGNOSIS — I739 Peripheral vascular disease, unspecified: Secondary | ICD-10-CM

## 2024-05-16 ENCOUNTER — Encounter (INDEPENDENT_AMBULATORY_CARE_PROVIDER_SITE_OTHER): Payer: Medicare PPO

## 2024-05-16 ENCOUNTER — Ambulatory Visit (INDEPENDENT_AMBULATORY_CARE_PROVIDER_SITE_OTHER): Payer: Medicare PPO | Admitting: Nurse Practitioner
